# Patient Record
Sex: Female | Born: 1990 | Hispanic: No | Marital: Single | Smoking: Current every day smoker
Health system: Southern US, Community
[De-identification: ages and names within clinical notes are randomized; demographics above are authoritative.]

## PROBLEM LIST (undated history)

## (undated) DIAGNOSIS — F410 Panic disorder [episodic paroxysmal anxiety] without agoraphobia: Secondary | ICD-10-CM

## (undated) DIAGNOSIS — R011 Cardiac murmur, unspecified: Secondary | ICD-10-CM

## (undated) DIAGNOSIS — A749 Chlamydial infection, unspecified: Secondary | ICD-10-CM

## (undated) DIAGNOSIS — R87629 Unspecified abnormal cytological findings in specimens from vagina: Secondary | ICD-10-CM

## (undated) HISTORY — DX: Unspecified abnormal cytological findings in specimens from vagina: R87.629

## (undated) HISTORY — PX: WISDOM TOOTH EXTRACTION: SHX21

---

## 2010-08-04 NOTE — L&D Delivery Note (Signed)
Delivery Note At 3:11 AM a viable female was delivered via Vaginal, Spontaneous Delivery (Presentation: ; Occiput Anterior). Infant vigorous APGAR: 9, 9; weight 7 lb 4 oz (3289 g).   Placenta status: Intact, Spontaneous, Cord: 3 vessels. Placenta visible at introitus immediately after delivery of infant. Large amount of blood and clots accompanied placenta c/w possible abruption.  Cord pH: NA  Anesthesia: Epidural  Episiotomy: None Lacerations: None Suture Repair: NA Est. Blood Loss (mL): 500  Mom to postpartum.  Baby to nursery-stable. Placenta to pathology. Bottle feeding Nexplanon for contraception  Dorathy Kinsman 07/22/2011, 3:37 AM

## 2010-12-16 LAB — HIV ANTIBODY (ROUTINE TESTING W REFLEX): HIV: NONREACTIVE

## 2010-12-16 LAB — CBC
HCT: 34 % — AB (ref 36–46)
Hemoglobin: 11.9 g/dL — AB (ref 12.0–16.0)

## 2010-12-16 LAB — GC/CHLAMYDIA PROBE AMP, GENITAL
Chlamydia: POSITIVE
Gonorrhea: NEGATIVE

## 2010-12-16 LAB — RPR: RPR: NONREACTIVE

## 2011-07-09 LAB — STREP B DNA PROBE: GBS: NEGATIVE

## 2011-07-21 ENCOUNTER — Inpatient Hospital Stay (HOSPITAL_COMMUNITY): Payer: Medicaid Other | Admitting: Anesthesiology

## 2011-07-21 ENCOUNTER — Encounter (HOSPITAL_COMMUNITY): Payer: Self-pay

## 2011-07-21 ENCOUNTER — Encounter (HOSPITAL_COMMUNITY): Payer: Self-pay | Admitting: Anesthesiology

## 2011-07-21 ENCOUNTER — Inpatient Hospital Stay (HOSPITAL_COMMUNITY)
Admission: AD | Admit: 2011-07-21 | Discharge: 2011-07-23 | DRG: 775 | Disposition: A | Discharge status: Home / Self Care | Payer: Medicaid Other | Source: Ambulatory Visit | Attending: Obstetrics & Gynecology | Admitting: Obstetrics & Gynecology

## 2011-07-21 DIAGNOSIS — IMO0001 Reserved for inherently not codable concepts without codable children: Secondary | ICD-10-CM

## 2011-07-21 HISTORY — DX: Chlamydial infection, unspecified: A74.9

## 2011-07-21 HISTORY — DX: Panic disorder (episodic paroxysmal anxiety): F41.0

## 2011-07-21 HISTORY — DX: Cardiac murmur, unspecified: R01.1

## 2011-07-21 LAB — CBC
MCHC: 30 g/dL (ref 30.0–36.0)
MCV: 70.2 fL — ABNORMAL LOW (ref 78.0–100.0)
Platelets: 257 10*3/uL (ref 150–400)
RDW: 18.6 % — ABNORMAL HIGH (ref 11.5–15.5)
WBC: 14.8 10*3/uL — ABNORMAL HIGH (ref 4.0–10.5)

## 2011-07-21 MED ORDER — LACTATED RINGERS IV SOLN
INTRAVENOUS | Status: DC
  Administered 2011-07-21 – 2011-07-22 (×2): via INTRAUTERINE

## 2011-07-21 MED ORDER — OXYCODONE-ACETAMINOPHEN 5-325 MG PO TABS: 2.0000 | ORAL_TABLET | ORAL | Status: DC | PRN

## 2011-07-21 MED ORDER — EPHEDRINE 5 MG/ML INJ
10.0000 mg | INTRAVENOUS | Status: DC | PRN
  Filled 2011-07-21: qty 4

## 2011-07-21 MED ORDER — IBUPROFEN 600 MG PO TABS
600.0000 mg | ORAL_TABLET | Freq: Four times a day (QID) | ORAL | Status: DC | PRN
  Administered 2011-07-22: 600 mg via ORAL
  Filled 2011-07-21: qty 1

## 2011-07-21 MED ORDER — LIDOCAINE HCL (PF) 1 % IJ SOLN
30.0000 mL | INTRAMUSCULAR | Status: DC | PRN
Start: 2011-07-21 — End: 2011-07-22
  Filled 2011-07-21: qty 30

## 2011-07-21 MED ORDER — PHENYLEPHRINE 40 MCG/ML (10ML) SYRINGE FOR IV PUSH (FOR BLOOD PRESSURE SUPPORT): 80.0000 ug | PREFILLED_SYRINGE | INTRAVENOUS | Status: DC | PRN

## 2011-07-21 MED ORDER — EPHEDRINE 5 MG/ML INJ: 10.0000 mg | INTRAVENOUS | Status: DC | PRN

## 2011-07-21 MED ORDER — FENTANYL 2.5 MCG/ML BUPIVACAINE 1/10 % EPIDURAL INFUSION (WH - ANES)
INTRAMUSCULAR | Status: DC | PRN
  Administered 2011-07-21: 14 mL/h via EPIDURAL

## 2011-07-21 MED ORDER — PHENYLEPHRINE 40 MCG/ML (10ML) SYRINGE FOR IV PUSH (FOR BLOOD PRESSURE SUPPORT)
80.0000 ug | PREFILLED_SYRINGE | INTRAVENOUS | Status: DC | PRN
  Filled 2011-07-21: qty 5

## 2011-07-21 MED ORDER — SODIUM BICARBONATE 8.4 % IV SOLN
INTRAVENOUS | Status: DC | PRN
  Administered 2011-07-21: 4 mL via EPIDURAL

## 2011-07-21 MED ORDER — OXYTOCIN 20 UNITS IN LACTATED RINGERS INFUSION - SIMPLE
1.0000 m[IU]/min | INTRAVENOUS | Status: DC
  Administered 2011-07-21: 2 m[IU]/min via INTRAVENOUS

## 2011-07-21 MED ORDER — NALBUPHINE SYRINGE 5 MG/0.5 ML
10.0000 mg | INJECTION | INTRAMUSCULAR | Status: DC | PRN
  Administered 2011-07-21: 10 mg via INTRAVENOUS
  Filled 2011-07-21 (×2): qty 1

## 2011-07-21 MED ORDER — LACTATED RINGERS IV SOLN
500.0000 mL | INTRAVENOUS | Status: DC | PRN
  Administered 2011-07-22: 1000 mL via INTRAVENOUS

## 2011-07-21 MED ORDER — TERBUTALINE SULFATE 1 MG/ML IJ SOLN
0.2500 mg | Freq: Once | INTRAMUSCULAR | Status: AC | PRN
  Administered 2011-07-21: 0.25 mg via SUBCUTANEOUS
  Filled 2011-07-21: qty 1

## 2011-07-21 MED ORDER — NALBUPHINE SYRINGE 5 MG/0.5 ML
5.0000 mg | INJECTION | INTRAMUSCULAR | Status: DC | PRN
  Filled 2011-07-21: qty 0.5

## 2011-07-21 MED ORDER — OXYTOCIN 20 UNITS IN LACTATED RINGERS INFUSION - SIMPLE: 125.0000 mL/h | Freq: Once | INTRAVENOUS | Status: DC

## 2011-07-21 MED ORDER — LACTATED RINGERS IV SOLN: 500.0000 mL | Freq: Once | INTRAVENOUS | Status: DC

## 2011-07-21 MED ORDER — DIPHENHYDRAMINE HCL 50 MG/ML IJ SOLN: 12.5000 mg | INTRAMUSCULAR | Status: DC | PRN

## 2011-07-21 MED ORDER — ACETAMINOPHEN 325 MG PO TABS: 650.0000 mg | ORAL_TABLET | ORAL | Status: DC | PRN

## 2011-07-21 MED ORDER — OXYTOCIN BOLUS FROM INFUSION
500.0000 mL | Freq: Once | INTRAVENOUS | Status: AC
  Administered 2011-07-22: 500 mL via INTRAVENOUS
  Filled 2011-07-21: qty 1000
  Filled 2011-07-21: qty 500

## 2011-07-21 MED ORDER — ONDANSETRON HCL 4 MG/2ML IJ SOLN
4.0000 mg | Freq: Four times a day (QID) | INTRAMUSCULAR | Status: DC | PRN
  Administered 2011-07-21: 4 mg via INTRAVENOUS
  Filled 2011-07-21: qty 2

## 2011-07-21 MED ORDER — FENTANYL 2.5 MCG/ML BUPIVACAINE 1/10 % EPIDURAL INFUSION (WH - ANES)
14.0000 mL/h | INTRAMUSCULAR | Status: DC
  Administered 2011-07-21 – 2011-07-22 (×3): 14 mL/h via EPIDURAL
  Filled 2011-07-21 (×4): qty 60

## 2011-07-21 MED ORDER — LACTATED RINGERS IV SOLN
INTRAVENOUS | Status: DC
  Administered 2011-07-21 (×4): via INTRAVENOUS

## 2011-07-21 MED ORDER — FLEET ENEMA 7-19 GM/118ML RE ENEM: 1.0000 | ENEMA | RECTAL | Status: DC | PRN

## 2011-07-21 MED ORDER — CITRIC ACID-SODIUM CITRATE 334-500 MG/5ML PO SOLN
30.0000 mL | ORAL | Status: DC | PRN
  Filled 2011-07-21: qty 15

## 2011-07-21 NOTE — Progress Notes (Signed)
Dr. Maren Reamer on unit and will see patient shortly.

## 2011-07-21 NOTE — Progress Notes (Signed)
Patient ID: Julia Castro, female   DOB: 1990/08/23, 20 y.o.   MRN: [id number]  Patient is now requesting epidural for pain relief.  FHR 130 with average variability and accels, Category I UCs every 1.5-2 minutes now  Cx deferred  Will give epidural and recheck later.

## 2011-07-21 NOTE — Progress Notes (Signed)
Pt states lost mucus plug 2 days ago, notes bloody show this am, ctx's q46minutes apart, denies lof, +FM, unsure of cervical exam.

## 2011-07-21 NOTE — Anesthesia Procedure Notes (Signed)
Epidural Patient location during procedure: OB  Preanesthetic Checklist Completed: patient identified, site marked, surgical consent, pre-op evaluation, timeout performed, IV checked, risks and benefits discussed and monitors and equipment checked  Epidural Patient position: sitting Prep: site prepped and draped and DuraPrep Patient monitoring: continuous pulse ox and blood pressure Approach: midline Injection technique: LOR air  Needle:  Needle type: Tuohy  Needle gauge: 17 G Needle length: 9 cm Needle insertion depth: 6 cm Catheter type: closed end flexible Catheter size: 19 Gauge Catheter at skin depth: 13 cm Test dose: negative  Assessment Events: blood not aspirated, injection not painful, no injection resistance, negative IV test and no paresthesia  Additional Notes Dosing of Epidural:  1st dose, through needle ............................................Marland Kitchen epi 1:200K + Xylocaine 40 mg  2nd dose, through catheter, after waiting 3 minutes...Marland KitchenMarland Kitchenepi 1:200K + Xylocaine 40 mg  3rd dose, through catheter after waiting 3 minutes .............................Marcaine   4mg    ( mg Marcaine are expressed as equivilent  cc's medication removed from the 0.1%Bupiv / fentanyl syringe from L&D pump)  ( 2% Xylo charted as a single dose in Epic Meds for ease of charting; actual dosing was fractionated as above, for saftey's sake)  As each dose occurred, patient was free of IV sx; and patient exhibited no evidence of SA injection.  Patient is more comfortable after epidural dosed. Please see RN's note for documentation of vital signs,and FHR which are stable.

## 2011-07-21 NOTE — Progress Notes (Signed)
Dr. Debroah Loop on unit - informed him of sve, fetal well being and UC pattern. Attempted position changes and fetal response to those changes. Pitocin will remain off for now. No new orders received.

## 2011-07-21 NOTE — Progress Notes (Signed)
Julia Castro is a 20 y.o. G2P1001 at [redacted]w[redacted]d admitted for SOL.   Subjective: Pt is currently comfortable.  Rcvd nubain x1, ctx have slowed down recently.    Objective: BP 113/71  Pulse 96  Temp(Src) 98.4 F (36.9 C) (Oral)  Resp 18  Ht 5' 1.25" (1.556 m)  Wt 84.086 kg (185 lb 6 oz)  BMI 34.74 kg/m2      FHT:  FHR: 140 bpm, variability: moderate,  accelerations:  Present,  decelerations:  Absent UC:   irregular, every 6-10 minutes SVE:   Dilation: 5.5 Effacement (%): 80 Station: -2 Exam by:: [place] and M. Williams, cnm  Labs: Lab Results  Component Value Date   WBC 14.8* 07/21/2011   HGB 8.6* 07/21/2011   HCT 28.7* 07/21/2011   MCV 70.2* 07/21/2011   PLT 257 07/21/2011    Assessment / Plan: Spontaneous labor, ctx slowed AROM Continue expectant managment  Labor: Progressing normally Preeclampsia:  N/a  Fetal Wellbeing:  Category I Pain Control:  nubain I/D:  n/a Anticipated MOD:  NSVD  O'Grady, Aniesa Boback 07/21/2011, 1:45 PM

## 2011-07-21 NOTE — H&P (Signed)
Julia Castro is a 20 y.o. female presenting for Contractions.  Her pregnancy has been followed at [facility] and remarkable for Chlamydia in first part of pregnancy (treated).   Last Pregnancy was 2 years ago.  Plans bottlefeeding. Circ: this is a female  Maternal Medical History:  Reason for admission: Reason for admission: contractions.  Reason for Admission:   nauseaContractions: Onset was 3-5 hours ago.   Frequency: irregular.   Perceived severity is moderate.    Fetal activity: Perceived fetal activity is normal.   Last perceived fetal movement was within the past hour.    Prenatal complications: Bleeding.     OB History    Grav Para Term Preterm Abortions TAB SAB Ect Mult Living   2 1 1  0 0 0 0 0 0 1     Past Medical History  Diagnosis Date  . Panic attacks    Past Surgical History  Procedure Date  . Wisdom tooth extraction    Family History: family history is negative for Anesthesia problems. Social History:  reports that she has never smoked. She has never used smokeless tobacco. She reports that she does not drink alcohol or use illicit drugs.  Review of Systems  Constitutional: Negative for fever.  Gastrointestinal: Positive for abdominal pain. Negative for nausea.    Dilation: 3.5 Effacement (%): 70 Station: -3 Exam by:: Artelia Laroche CNM Blood pressure 139/59, pulse 96, temperature 98.5 F (36.9 C), temperature source Oral, resp. rate 18, height 5' 1.25" (1.556 m), weight 185 lb 6 oz (84.086 kg). Maternal Exam:  Uterine Assessment: Contraction strength is moderate.  Contraction frequency is irregular.   Abdomen: Fundal height is 39.   Estimated fetal weight is 8.   Fetal presentation: vertex  Introitus: Normal vulva. Vagina is positive for vaginal discharge.  Ferning test: not done.  Nitrazine test: not done. Amniotic fluid character: not assessed.  Pelvis: adequate for delivery.   Cervix: Cervix evaluated by digital exam.     Fetal  Exam Fetal Monitor Review: Mode: ultrasound.   Baseline rate: 135.  Variability: moderate (6-25 bpm).   Pattern: no decelerations and accelerations present.    Fetal State Assessment: Category I - tracings are normal.     Physical Exam  Constitutional: She is oriented to person, place, and time. She appears well-developed and well-nourished.  HENT:  Head: Normocephalic.  Cardiovascular: Normal rate.   Respiratory: Effort normal.  GI: Soft. She exhibits no distension. There is no tenderness. There is no rebound and no guarding.  Genitourinary: Uterus normal. Vaginal discharge found.  Musculoskeletal: Normal range of motion. She exhibits edema (Trace).  Neurological: She is alert and oriented to person, place, and time.  Skin: Skin is warm and dry.  Psychiatric: She has a normal mood and affect.    Prenatal labs: ABO, Rh:  O+ Antibody:  Neg Rubella:  Immune RPR:   NR HBsAg:   Neg HIV:   NR GBS:   Neg  Assessment/Plan: A:  IUP at [redacted]w[redacted]d      Early active labor.  Though contractions are somewhat irregular, her last cervical exam was closed and today is almost 4.  She seems somewhat uncomfortable with UCs and lives far away.       GBS Negative P:  Will admit for labor  [facility] 07/21/2011, 9:03 AM

## 2011-07-21 NOTE — Progress Notes (Signed)
Julia Castro is a 20 y.o. G2P1001 at 102w3d by ultrasound admitted for active labor  Subjective:   Objective: BP 111/54  Pulse 89  Temp(Src) 98.2 F (36.8 C) (Oral)  Resp 20  Ht 5' 1.25" (1.556 m)  Wt 84.086 kg (185 lb 6 oz)  BMI 34.74 kg/m2      FHT:  FHR: 120 bpm, variability: moderate,  accelerations:  Abscent,  decelerations:  Present At 1835, had variable decels which developed into prolonged decel/brady to 90s. UC:   regular, every 1-2  minutes SVE:   Dilation: 7 Effacement (%): 80 Station: -2 Exam by:: Wynelle Bourgeois, cnm  Labs: Lab Results  Component Value Date   WBC 14.8* 07/21/2011   HGB 8.6* 07/21/2011   HCT 28.7* 07/21/2011   MCV 70.2* 07/21/2011   PLT 257 07/21/2011    Assessment / Plan: Augmentation of labor, progressing well Cervix progressed to 7cm and head better applied Variable decels with one episode of bradycardia, probably related to frequent contractions.   Labor: slowly progressing due to prior asynclitism Preeclampsia:  n/a Fetal Wellbeing:  Category II Pain Control:  Epidural I/D:  n/a IUPC applied and Amnioinfusion begun.  ISE applied for easier assessment of FHR Pitocin turned off and Terbutaline given to give baby a rest Position changes and Trendelenberg employed with improvement in FHR seen  Anticipated MOD:  NSVD  [facility] 07/21/2011, 6:57 PM

## 2011-07-21 NOTE — Anesthesia Preprocedure Evaluation (Signed)
Anesthesia Evaluation  Patient identified by MRN, date of birth, ID band Patient awake    Reviewed: Allergy & Precautions, H&P , Patient's Chart, lab work & pertinent test results  Airway Mallampati: II TM Distance: >3 FB Neck ROM: full    Dental  (+) Teeth Intact   Pulmonary  clear to auscultation        Cardiovascular regular Normal    Neuro/Psych    GI/Hepatic   Endo/Other  Morbid obesity  Renal/GU      Musculoskeletal   Abdominal   Peds  Hematology   Anesthesia Other Findings       Reproductive/Obstetrics (+) Pregnancy                           Anesthesia Physical Anesthesia Plan  ASA: III  Anesthesia Plan: Epidural   Post-op Pain Management:    Induction:   Airway Management Planned:   Additional Equipment:   Intra-op Plan:   Post-operative Plan:   Informed Consent: I have reviewed the patients History and Physical, chart, labs and discussed the procedure including the risks, benefits and alternatives for the proposed anesthesia with the patient or authorized representative who has indicated his/her understanding and acceptance.   Dental Advisory Given  Plan Discussed with:   Anesthesia Plan Comments: (Labs checked- platelets confirmed with RN in room. Fetal heart tracing, per RN, reported to be stable enough for sitting procedure. Discussed epidural, and patient consents to the procedure:  included risk of possible headache,backache, failed block, allergic reaction, and nerve injury. This patient was asked if she had any questions or concerns before the procedure started. )        Anesthesia Quick Evaluation  

## 2011-07-22 ENCOUNTER — Other Ambulatory Visit: Payer: Self-pay | Admitting: Advanced Practice Midwife

## 2011-07-22 ENCOUNTER — Encounter (HOSPITAL_COMMUNITY): Payer: Self-pay | Admitting: *Deleted

## 2011-07-22 MED ORDER — BUPIVACAINE HCL (PF) 0.25 % IJ SOLN
INTRAMUSCULAR | Status: DC | PRN
  Administered 2011-07-22: 3 mL

## 2011-07-22 MED ORDER — WITCH HAZEL-GLYCERIN EX PADS
1.0000 "application " | MEDICATED_PAD | CUTANEOUS | Status: DC | PRN
  Administered 2011-07-22: 1 via TOPICAL

## 2011-07-22 MED ORDER — SENNOSIDES-DOCUSATE SODIUM 8.6-50 MG PO TABS
2.0000 | ORAL_TABLET | Freq: Every day | ORAL | Status: DC
  Administered 2011-07-22: 2 via ORAL

## 2011-07-22 MED ORDER — ONDANSETRON HCL 4 MG/2ML IJ SOLN: 4.0000 mg | INTRAMUSCULAR | Status: DC | PRN

## 2011-07-22 MED ORDER — FENTANYL CITRATE 0.05 MG/ML IJ SOLN: 200.0000 ug | Freq: Once | INTRAMUSCULAR | Status: DC

## 2011-07-22 MED ORDER — OXYCODONE-ACETAMINOPHEN 5-325 MG PO TABS
1.0000 | ORAL_TABLET | ORAL | Status: DC | PRN
  Administered 2011-07-22 (×2): 1 via ORAL
  Filled 2011-07-22 (×2): qty 1

## 2011-07-22 MED ORDER — DIBUCAINE 1 % RE OINT
1.0000 "application " | TOPICAL_OINTMENT | RECTAL | Status: DC | PRN
  Administered 2011-07-22: 1 via RECTAL
  Filled 2011-07-22: qty 28

## 2011-07-22 MED ORDER — DIPHENHYDRAMINE HCL 25 MG PO CAPS: 25.0000 mg | ORAL_CAPSULE | Freq: Four times a day (QID) | ORAL | Status: DC | PRN

## 2011-07-22 MED ORDER — LANOLIN HYDROUS EX OINT: TOPICAL_OINTMENT | CUTANEOUS | Status: DC | PRN

## 2011-07-22 MED ORDER — TETANUS-DIPHTH-ACELL PERTUSSIS 5-2.5-18.5 LF-MCG/0.5 IM SUSP
0.5000 mL | Freq: Once | INTRAMUSCULAR | Status: AC
  Administered 2011-07-23: 0.5 mL via INTRAMUSCULAR
  Filled 2011-07-22: qty 0.5

## 2011-07-22 MED ORDER — SIMETHICONE 80 MG PO CHEW: 80.0000 mg | CHEWABLE_TABLET | ORAL | Status: DC | PRN

## 2011-07-22 MED ORDER — FENTANYL CITRATE 0.05 MG/ML IJ SOLN
INTRAMUSCULAR | Status: DC | PRN
  Administered 2011-07-22: 100 ug via INTRAVENOUS

## 2011-07-22 MED ORDER — ZOLPIDEM TARTRATE 5 MG PO TABS: 5.0000 mg | ORAL_TABLET | Freq: Every evening | ORAL | Status: DC | PRN

## 2011-07-22 MED ORDER — BENZOCAINE-MENTHOL 20-0.5 % EX AERO: 1.0000 "application " | INHALATION_SPRAY | CUTANEOUS | Status: DC | PRN

## 2011-07-22 MED ORDER — PRENATAL PLUS 27-1 MG PO TABS
1.0000 | ORAL_TABLET | Freq: Every day | ORAL | Status: DC
  Administered 2011-07-22 – 2011-07-23 (×2): 1 via ORAL
  Filled 2011-07-22 (×2): qty 1

## 2011-07-22 MED ORDER — FENTANYL CITRATE 0.05 MG/ML IJ SOLN: 100.0000 ug | Freq: Once | INTRAMUSCULAR | Status: DC

## 2011-07-22 MED ORDER — ONDANSETRON HCL 4 MG PO TABS: 4.0000 mg | ORAL_TABLET | ORAL | Status: DC | PRN

## 2011-07-22 MED ORDER — IBUPROFEN 600 MG PO TABS
600.0000 mg | ORAL_TABLET | Freq: Four times a day (QID) | ORAL | Status: DC
Start: 2011-07-22 — End: 2011-07-23
  Administered 2011-07-22 – 2011-07-23 (×5): 600 mg via ORAL
  Filled 2011-07-22 (×5): qty 1

## 2011-07-22 MED ORDER — FENTANYL CITRATE 0.05 MG/ML IJ SOLN
INTRAMUSCULAR | Status: AC
  Filled 2011-07-22: qty 2

## 2011-07-22 NOTE — Anesthesia Postprocedure Evaluation (Signed)
  Anesthesia Post Note  Patient: Julia Castro  Procedure(s) Performed: * No procedures listed *  Anesthesia type: Epidural  Patient location: Mother/Baby  Post pain: Pain level controlled  Post assessment: Post-op Vital signs reviewed  Last Vitals:  Filed Vitals:   07/22/11 0640  BP: 139/76  Pulse: 125  Temp: 37.1 C  Resp: 20    Post vital signs: Reviewed  Level of consciousness:alert  Complications: No apparent anesthesia complications

## 2011-07-22 NOTE — Progress Notes (Signed)
UR chart review completed.  

## 2011-07-22 NOTE — Progress Notes (Signed)
Domenica L Glendenning is a 20 y.o. G2P1001 at [redacted]w[redacted]d.   Subjective: Back pain w/ last UC.  Objective: BP 130/67  Pulse 88  Temp(Src) 99.2 F (37.3 C) (Axillary)  Resp 16  Ht 5' 1.25" (1.556 m)  Wt 84.086 kg (185 lb 6 oz)  BMI 34.74 kg/m2  SpO2 100%   Total I/O In: -  Out: 800 [Urine:800]  FHT:  FHR: 140 bpm, variability: moderate,  accelerations:  Present,  decelerations:  Present repetitive mild, occassional moderate variables. Prolonged decel x 5 minutes down to 80's after position change. Resolved w/ position change back to left tilt. UC:   regular, every 2-4 minutes. MVU's 150-160 SVE:   Dilation: 8 Effacement (%): 90 Station: 0 Exam by:: Rhyder Bratz, CNM   Labs:  Assessment / Plan: Protracted active phase  Labor: No progress since last exam, but inadequate contractions, unable to restart pitocin due to decels. Preeclampsia:  NA Fetal Wellbeing:  Category II, overall reassuring Pain Control:  Epidural I/D:  n/a Anticipated MOD:  Uncertain. Discussed possible need for C/S if decels worsen or if labor does not progress w/ UC's tolerable to baby. Dr. Debroah Loop notified of decels. Agrees w/ POC.  Luverna Degenhart 07/22/2011, 12:14 AM

## 2011-07-22 NOTE — Addendum Note (Signed)
Addendum  created 07/22/11 0757 by Cephus Shelling   Modules edited:Charges VN, Notes Section

## 2011-07-22 NOTE — Anesthesia Postprocedure Evaluation (Signed)
Anesthesia Post Note  Patient: Julia Castro  Procedure(s) Performed: * No procedures listed *  Anesthesia type: Epidural  Patient location: Mother/Baby  Post pain: Pain level controlled  Post assessment: Post-op Vital signs reviewed  Last Vitals:  Filed Vitals:   07/22/11 0346  BP: 100/57  Pulse: 113  Temp:   Resp:     Post vital signs: Reviewed  Level of consciousness: awake  Complications: No apparent anesthesia complications

## 2011-07-22 NOTE — Progress Notes (Signed)
Julia Castro is a 20 y.o. G2P1001 at [redacted]w[redacted]d   Subjective: Comfortable w/ epidural  Objective: BP 112/66  Pulse 83  Temp(Src) 99.2 F (37.3 C) (Axillary)  Resp 16  Ht 5' 1.25" (1.556 m)  Wt 84.086 kg (185 lb 6 oz)  BMI 34.74 kg/m2  SpO2 100%   Total I/O In: -  Out: 800 [Urine:800]  FHT:  FHR: 140-150 bpm, variability: moderate,  accelerations:  Present,  decelerations:  Present Variables UC:   regular, every 2-4 minutes, MVU's150-160 SVE:   Dilation: 8 Effacement (%): 90 Station: 0 Exam by::RN  Labs:  Assessment / Plan: Spontaneous labor, progressing normally  Labor: Progressing normally Preeclampsia:  NA Fetal Wellbeing:  Category II Pain Control:  Epidural I/D:  n/a Anticipated MOD:  NSVD  Julia Castro 07/22/2011, 12:52 AM

## 2011-07-22 NOTE — Progress Notes (Signed)
Julia Castro is a 20 y.o. G2P1001 at [redacted]w[redacted]d. CNM called to BS due to panic attack. Pt calm when CNM arrived in room. States she was panicking because of contraction pain, general discomfort and concern about decelerations.   Subjective: Pain in right side of back w/ UC's.   Objective: BP 130/67  Pulse 88  Temp(Src) 99.2 F (37.3 C) (Axillary)  Resp 16  Ht 5' 1.25" (1.556 m)  Wt 84.086 kg (185 lb 6 oz)  BMI 34.74 kg/m2  SpO2 100%   Total I/O In: -  Out: 800 [Urine:800]  FHT:  FHR: 155 bpm, variability: moderate,  accelerations:  Abscent,  decelerations:  Present Variables significantly improved w/ position changes, D/C pitocin. Category II now. UC:   regular, every 2-4 minutes. SVE:   Dilation: 7 Effacement (%): 90 Station: -1 Exam by:: C. Blackstock, RN  Labs:  Assessment / Plan: Spontaneous labor, progressing normally, variables improving w/ interventions.  Labor: Progressing normally Preeclampsia:  NA Fetal Wellbeing:  Category II Pain Control:  Epidural. Will try PCA. I/D:  n/a Anticipated MOD:  NSVD  Brinton Brandel 07/21/2011, 20:38 AM

## 2011-07-22 NOTE — Addendum Note (Signed)
Addendum  created 07/22/11 0756 by Cephus Shelling   Modules edited:Notes Section

## 2011-07-22 NOTE — Addendum Note (Signed)
Addendum  created 07/22/11 0757 by Cephus Shelling   Modules edited:Charges VN

## 2011-07-22 NOTE — Progress Notes (Signed)

## 2011-07-23 LAB — CBC
Hemoglobin: 6.4 g/dL — CL (ref 12.0–15.0)
MCHC: 29.9 g/dL — ABNORMAL LOW (ref 30.0–36.0)
Platelets: 233 10*3/uL (ref 150–400)

## 2011-07-23 MED ORDER — SENNOSIDES-DOCUSATE SODIUM 8.6-50 MG PO TABS: 2.0000 | ORAL_TABLET | Freq: Every day | ORAL | Status: DC

## 2011-07-23 MED ORDER — SENNOSIDES-DOCUSATE SODIUM 8.6-50 MG PO TABS: 2.0000 | ORAL_TABLET | Freq: Every day | ORAL | Status: AC | PRN

## 2011-07-23 NOTE — Progress Notes (Signed)
Post Partum Day 1 Subjective: no complaints, up ad lib, voiding, tolerating PO and denies any lightheadedness, ShOB, dizziness, weakness.   Objective: Blood pressure 94/57, pulse 77, temperature 98.2 F (36.8 C), temperature source Oral, resp. rate 18, height 5' 1.25" (1.556 m), weight 84.086 kg (185 lb 6 oz), SpO2 99.00%, unknown if currently breastfeeding.  Physical Exam:  General: alert, cooperative and no distress Resp: CTAB Card: S1, S2; systolic murmur (3/6) Lochia: appropriate Uterine Fundus: firm Incision: n/a  DVT Evaluation: No cords or calf tenderness. No significant calf/ankle edema.   Basename 07/23/11 0525 07/21/11 0955  HGB 6.4* 8.6*  HCT 21.4* 28.7*    Assessment/Plan: Pt would like to go home today.  Asx anemia (Hgb 6.4), though tachycardic all day yesterday, will discuss with team.  Contraception: implanon. Bottle feeding.     LOS: 2 days   Levada Schilling 07/23/2011, 7:31 AM

## 2011-07-23 NOTE — Progress Notes (Signed)
CRITICAL VALUE ALERT  Critical value received: Hgb 6.4  Date of notification: 07/23/11  Time of notification:  0640     Critical value read back :yes  Nurse who received alert:  Marylene Land Woodard Perrell RN   MD notified (1st page):  With morning rounds  Time of first page: 0700  MD notified (2nd page):on rounds  Time of second page:asdf   Responding MD:  On  Time MD responded:  Am

## 2011-07-23 NOTE — Discharge Summary (Signed)
Obstetric Discharge Summary Julia Castro now P2002 who delivered at [redacted]w[redacted]d by NSVD, female infant.  Reason for Admission: onset of labor Prenatal Procedures: none Intrapartum Procedures: spontaneous vaginal delivery Postpartum Procedures: none Complications-Operative and Postpartum: none Hemoglobin  Date Value Range Status  07/23/2011 6.4* 12.0-15.0 (g/dL) Final     DELTA CHECK NOTED     REPEATED TO VERIFY     CRITICAL RESULT CALLED TO, READ BACK BY AND VERIFIED WITH:     BERRIER, A. @ 0636 ON 07/23/11 BY BOVELL. A     HCT  Date Value Range Status  07/23/2011 21.4* 36.0-46.0 (%) Final    Discharge Diagnoses: Term Pregnancy-delivered  Discharge Information: Date: 07/23/2011 Activity: pelvic rest Diet: routine Medications: Iron and senna Condition: stable Instructions: refer to practice specific booklet, pt should call or return to care with any questions or concerns.  Discharge to: home Follow-up Information    Follow up with [facility] in 1 week. (PLease call to make an appointment to check blood levels.  Post partum visit in 6 weeks. )    Contact information:   [address] [place] [address]          Newborn Data: Live born female  Birth Weight: 7 lb 4 oz (3289 g) APGAR: 9, 9  Home with mother.  Maren Reamer, Darene Nappi 07/23/2011, 10:33 AM

## 2011-07-30 NOTE — Discharge Summary (Signed)
Attestation of Attending Supervision of Resident: Evaluation and management procedures were performed by the Family Medicine Resident under my supervision.  I have reviewed the resident's note, chart reviewed and agree with management and plan.  Yeraldi Fidler, M.D. 07/30/2011 8:32 AM    

## 2012-12-07 ENCOUNTER — Encounter: Payer: Self-pay | Admitting: Obstetrics & Gynecology

## 2012-12-07 ENCOUNTER — Ambulatory Visit (INDEPENDENT_AMBULATORY_CARE_PROVIDER_SITE_OTHER): Payer: Medicaid Other | Admitting: Obstetrics & Gynecology

## 2012-12-07 VITALS — BP 110/60 | Ht 61.0 in | Wt 168.0 lb

## 2012-12-07 DIAGNOSIS — Z3201 Encounter for pregnancy test, result positive: Secondary | ICD-10-CM

## 2012-12-07 DIAGNOSIS — Z3200 Encounter for pregnancy test, result unknown: Secondary | ICD-10-CM

## 2012-12-08 ENCOUNTER — Other Ambulatory Visit: Payer: Self-pay | Admitting: Obstetrics & Gynecology

## 2012-12-08 DIAGNOSIS — O3680X0 Pregnancy with inconclusive fetal viability, not applicable or unspecified: Secondary | ICD-10-CM

## 2012-12-15 ENCOUNTER — Other Ambulatory Visit: Payer: Medicaid Other

## 2012-12-23 ENCOUNTER — Encounter: Payer: Medicaid Other | Admitting: Advanced Practice Midwife

## 2013-08-04 ENCOUNTER — Encounter (HOSPITAL_COMMUNITY): Payer: Self-pay | Admitting: Emergency Medicine

## 2013-08-04 ENCOUNTER — Emergency Department (HOSPITAL_COMMUNITY)
Admission: EM | Admit: 2013-08-04 | Discharge: 2013-08-04 | Disposition: A | Discharge status: Home / Self Care | Payer: Medicaid Other
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
## 2013-09-04 HISTORY — PX: INDUCED ABORTION: SHX677

## 2014-06-05 ENCOUNTER — Encounter (HOSPITAL_COMMUNITY): Payer: Self-pay | Admitting: Emergency Medicine

## 2015-04-10 ENCOUNTER — Other Ambulatory Visit (HOSPITAL_COMMUNITY)
Admission: RE | Admit: 2015-04-10 | Discharge: 2015-04-10 | Disposition: A | Discharge status: Home / Self Care | Payer: Medicaid Other | Source: Ambulatory Visit | Attending: Obstetrics & Gynecology | Admitting: Obstetrics & Gynecology

## 2015-04-10 ENCOUNTER — Encounter: Payer: Self-pay | Admitting: Obstetrics & Gynecology

## 2015-04-10 ENCOUNTER — Ambulatory Visit (INDEPENDENT_AMBULATORY_CARE_PROVIDER_SITE_OTHER): Payer: Medicaid Other | Admitting: Obstetrics & Gynecology

## 2015-04-10 VITALS — BP 120/80 | HR 72 | Ht 61.0 in | Wt 174.0 lb

## 2015-04-10 DIAGNOSIS — Z01419 Encounter for gynecological examination (general) (routine) without abnormal findings: Secondary | ICD-10-CM

## 2015-04-10 DIAGNOSIS — Z0000 Encounter for general adult medical examination without abnormal findings: Secondary | ICD-10-CM

## 2015-04-10 MED ORDER — DESOGESTREL-ETHINYL ESTRADIOL 0.15-30 MG-MCG PO TABS: 1.0000 | ORAL_TABLET | Freq: Every day | ORAL | Status: DC

## 2015-04-10 NOTE — Progress Notes (Signed)
Patient ID: Julia Castro, female   DOB: 11-Mar-1991, 24 y.o.   MRN: [id number] Subjective:     Julia Castro is a 24 y.o. female here for a routine exam.  Patient's last menstrual period was 04/05/2015. [id number] Birth Control Method:  none Menstrual Calendar(currently): regular  Current complaints: volume of discharge, multiple evals are negative.   Current acute medical issues:  none   Recent Gynecologic History Patient's last menstrual period was 04/05/2015. Last Pap: 11/2013 ,  normal Last mammogram: ,  Not applicable: age  Past Medical History  Diagnosis Date  . Panic attacks   . Heart murmur     Benign  . Chlamydia     Past Surgical History  Procedure Laterality Date  . Wisdom tooth extraction      OB History    Gravida Para Term Preterm AB TAB SAB Ectopic Multiple Living   0 0 0 0 0 0 2      Social History   Social History  . Marital Status: Single    Spouse Name: N/A  . Number of Children: N/A  . Years of Education: N/A   Social History Main Topics  . Smoking status: Never Smoker   . Smokeless tobacco: Never Used  . Alcohol Use: No  . Drug Use: No  . Sexual Activity: Yes    Birth Control/ Protection: None   Other Topics Concern  . None   Social History Narrative    Family History  Problem Relation Age of Onset  . Anesthesia problems Neg Hx      Current outpatient prescriptions:  .  flintstones complete (FLINTSTONES) 60 MG chewable tablet, Chew 1 tablet by mouth daily.  , Disp: , Rfl:  .  IRON PO, Take 1 tablet by mouth daily.  , Disp: , Rfl:  .  omeprazole (PRILOSEC) 20 MG capsule, Take 20 mg by mouth daily.  , Disp: , Rfl:   Review of Systems  Review of Systems  Constitutional: Negative for fever, chills, weight loss, malaise/fatigue and diaphoresis.  HENT: Negative for hearing loss, ear pain, nosebleeds, congestion, sore throat, neck pain, tinnitus and ear discharge.   Eyes: Negative for blurred vision, double vision, photophobia,  pain, discharge and redness.  Respiratory: Negative for cough, hemoptysis, sputum production, shortness of breath, wheezing and stridor.   Cardiovascular: Negative for chest pain, palpitations, orthopnea, claudication, leg swelling and PND.  Gastrointestinal: negative for abdominal pain. Negative for heartburn, nausea, vomiting, diarrhea, constipation, blood in stool and melena.  Genitourinary: Negative for dysuria, urgency, frequency, hematuria and flank pain.  Musculoskeletal: Negative for myalgias, back pain, joint pain and falls.  Skin: Negative for itching and rash.  Neurological: Negative for dizziness, tingling, tremors, sensory change, speech change, focal weakness, seizures, loss of consciousness, weakness and headaches.  Endo/Heme/Allergies: Negative for environmental allergies and polydipsia. Does not bruise/bleed easily.  Psychiatric/Behavioral: Negative for depression, suicidal ideas, hallucinations, memory loss and substance abuse. The patient is not nervous/anxious and does not have insomnia.        Objective:  Blood pressure 120/80, pulse 72, height  (1.549 m), weight 174 lb (78.926 kg), last menstrual period 04/05/2015.   Physical Exam  Vitals reviewed. Constitutional: She is oriented to person, place, and time. She appears well-developed and well-nourished.  HENT:  Head: Normocephalic and atraumatic.        Right Ear: External ear normal.  Left Ear: External ear normal.  Nose: Nose normal.  Mouth/Throat: Oropharynx is clear and  moist.  Eyes: Conjunctivae and EOM are normal. Pupils are equal, round, and reactive to light. Right eye exhibits no discharge. Left eye exhibits no discharge. No scleral icterus.  Neck: Normal range of motion. Neck supple. No tracheal deviation present. No thyromegaly present.  Cardiovascular: Normal rate, regular rhythm, normal heart sounds and intact distal pulses.  Exam reveals no gallop and no friction rub.   No murmur  heard. Respiratory: Effort normal and breath sounds normal. No respiratory distress. She has no wheezes. She has no rales. She exhibits no tenderness.  GI: Soft. Bowel sounds are normal. She exhibits no distension and no mass. There is no tenderness. There is no rebound and no guarding.  Genitourinary:  Breasts no masses skin changes or nipple changes bilaterally      Vulva is normal without lesions Vagina is pink moist without discharge Cervix normal in appearance and pap is done Uterus is normal size shape and contour Adnexa is negative with normal sized ovaries   Musculoskeletal: Normal range of motion. She exhibits no edema and no tenderness.  Neurological: She is alert and oriented to person, place, and time. She has normal reflexes. She displays normal reflexes. No cranial nerve deficit. She exhibits normal muscle tone. Coordination normal.  Skin: Skin is warm and dry. No rash noted. No erythema. No pallor.  Psychiatric: She has a normal mood and affect. Her behavior is normal. Judgment and thought content normal.       Assessment:    Healthy female exam.    Plan:    Contraception: OCP (estrogen/progesterone).    Desogestrel/EE 30 mics/monophasic Discussed strategies for remembering OCP, D/C nexplanon after 2 months due to bleeding

## 2015-04-11 LAB — CYTOLOGY - PAP

## 2015-05-08 ENCOUNTER — Telehealth: Payer: Self-pay | Admitting: Obstetrics & Gynecology

## 2015-05-08 NOTE — Telephone Encounter (Signed)
Pt states been taking birth control pills since her appt with Dr. Despina Hidden on 04/10/2015 started the "blue pill" last Tuesday now having a period, heavy bleeding and clots over the weekend but now vaginal bleeding has improved and no clots. Pt states she did miss one of her pills. Pt informed per Joellyn Haff, CNM to take a pregnancy test due to missing a pill and continue to monitor the vaginal bleeding. If pt has to change her period q hour and frequent clots call our office back. Pt verbalized understanding.

## 2015-06-11 ENCOUNTER — Telehealth: Payer: Self-pay | Admitting: Obstetrics & Gynecology

## 2015-06-11 MED ORDER — NORGESTIMATE-ETH ESTRADIOL 0.25-35 MG-MCG PO TABS: 1.0000 | ORAL_TABLET | Freq: Every day | ORAL | Status: DC

## 2015-08-05 NOTE — L&D Delivery Note (Signed)
Delivery Note  After a 5 minute 2nd stage, at 0315  a viable female was delivered via  (Presentation: ).  APGAR:  ; weight pending.  40 units of pitocin diluted in 1000cc LR was infused rapidly IV.  After 2 minutes, the cord was clamped and cut. 40 units of pitocin diluted in 1000cc LR was infused rapidly IV.  The placenta separated spontaneously and delivered via CCT and maternal pushing effort.  It was inspected and appears to be intact with a 3 VC.  There were the following complications:   Anesthesia: Epidural  Episiotomy: none Lacerations: none Suture Repair: n/a Est. Blood Loss (mL):   Mom to postpartum.  Baby to Couplet care / Skin to Skin   Delivery by Link SnufferEddie, Med student, under my direct supervision. .       After 2 minutes, the cord was clamped and cut. 40 units of pitocin diluted in 1000cc LR was infused rapidly IV.  The placenta separated spontaneously and delivered via CCT and maternal pushing effort.  It was inspected and appears to be intact with a 3 VC.

## 2015-08-09 ENCOUNTER — Encounter: Payer: Self-pay | Admitting: Adult Health

## 2015-08-09 ENCOUNTER — Ambulatory Visit (INDEPENDENT_AMBULATORY_CARE_PROVIDER_SITE_OTHER): Payer: Medicaid Other | Admitting: Adult Health

## 2015-08-09 VITALS — BP 140/82 | HR 82 | Ht 61.0 in | Wt 180.0 lb

## 2015-08-09 DIAGNOSIS — R1084 Generalized abdominal pain: Secondary | ICD-10-CM | POA: Diagnosis not present

## 2015-08-09 DIAGNOSIS — N926 Irregular menstruation, unspecified: Secondary | ICD-10-CM | POA: Diagnosis not present

## 2015-08-09 DIAGNOSIS — Z3201 Encounter for pregnancy test, result positive: Secondary | ICD-10-CM

## 2015-08-09 DIAGNOSIS — O3680X0 Pregnancy with inconclusive fetal viability, not applicable or unspecified: Secondary | ICD-10-CM

## 2015-08-09 DIAGNOSIS — Z3490 Encounter for supervision of normal pregnancy, unspecified, unspecified trimester: Secondary | ICD-10-CM

## 2015-08-09 LAB — POCT URINE PREGNANCY: Preg Test, Ur: POSITIVE — AB

## 2015-08-09 NOTE — Patient Instructions (Signed)
Return in 1 week for US  First Trimester of Pregnancy The first trimester of pregnancy is from week 1 until the end of week 12 (months 1 through 3). A week after a sperm fertilizes an egg, the egg will implant on the wall of the uterus. This embryo will begin to develop into a baby. Genes from you and your partner are forming the baby. The female genes determine whether the baby is a boy or a girl. At 6-8 weeks, the eyes and face are formed, and the heartbeat can be seen on ultrasound. At the end of 12 weeks, all the baby's organs are formed.  Now that you are pregnant, you will want to do everything you can to have a healthy baby. Two of the most important things are to get good prenatal care and to follow your health care provider's instructions. Prenatal care is all the medical care you receive before the baby's birth. This care will help prevent, find, and treat any problems during the pregnancy and childbirth. BODY CHANGES Your body goes through many changes during pregnancy. The changes vary from woman to woman.   You may gain or lose a couple of pounds at first.  You may feel sick to your stomach (nauseous) and throw up (vomit). If the vomiting is uncontrollable, call your health care provider.  You may tire easily.  You may develop headaches that can be relieved by medicines approved by your health care provider.  You may urinate more often. Painful urination may mean you have a bladder infection.  You may develop heartburn as a result of your pregnancy.  You may develop constipation because certain hormones are causing the muscles that push waste through your intestines to slow down.  You may develop hemorrhoids or swollen, bulging veins (varicose veins).  Your breasts may begin to grow larger and become tender. Your nipples may stick out more, and the tissue that surrounds them (areola) may become darker.  Your gums may bleed and may be sensitive to brushing and flossing.  Dark  spots or blotches (chloasma, mask of pregnancy) may develop on your face. This will likely fade after the baby is born.  Your menstrual periods will stop.  You may have a loss of appetite.  You may develop cravings for certain kinds of food.  You may have changes in your emotions from day to day, such as being excited to be pregnant or being concerned that something may go wrong with the pregnancy and baby.  You may have more vivid and strange dreams.  You may have changes in your hair. These can include thickening of your hair, rapid growth, and changes in texture. Some women also have hair loss during or after pregnancy, or hair that feels dry or thin. Your hair will most likely return to normal after your baby is born. WHAT TO EXPECT AT YOUR PRENATAL VISITS During a routine prenatal visit:  You will be weighed to make sure you and the baby are growing normally.  Your blood pressure will be taken.  Your abdomen will be measured to track your baby's growth.  The fetal heartbeat will be listened to starting around week 10 or 12 of your pregnancy.  Test results from any previous visits will be discussed. Your health care provider may ask you:  How you are feeling.  If you are feeling the baby move.  If you have had any abnormal symptoms, such as leaking fluid, bleeding, severe headaches, or abdominal cramping.    If you are using any tobacco products, including cigarettes, chewing tobacco, and electronic cigarettes.  If you have any questions. Other tests that may be performed during your first trimester include:  Blood tests to find your blood type and to check for the presence of any previous infections. They will also be used to check for low iron levels (anemia) and Rh antibodies. Later in the pregnancy, blood tests for diabetes will be done along with other tests if problems develop.  Urine tests to check for infections, diabetes, or protein in the urine.  An ultrasound to  confirm the proper growth and development of the baby.  An amniocentesis to check for possible genetic problems.  Fetal screens for spina bifida and Down syndrome.  You may need other tests to make sure you and the baby are doing well.  HIV (human immunodeficiency virus) testing. Routine prenatal testing includes screening for HIV, unless you choose not to have this test. HOME CARE INSTRUCTIONS  Medicines  Follow your health care provider's instructions regarding medicine use. Specific medicines may be either safe or unsafe to take during pregnancy.  Take your prenatal vitamins as directed.  If you develop constipation, try taking a stool softener if your health care provider approves. Diet  Eat regular, well-balanced meals. Choose a variety of foods, such as meat or vegetable-based protein, fish, milk and low-fat dairy products, vegetables, fruits, and whole grain breads and cereals. Your health care provider will help you determine the amount of weight gain that is right for you.  Avoid raw meat and uncooked cheese. These carry germs that can cause birth defects in the baby.  Eating four or five small meals rather than three large meals a day may help relieve nausea and vomiting. If you start to feel nauseous, eating a few soda crackers can be helpful. Drinking liquids between meals instead of during meals also seems to help nausea and vomiting.  If you develop constipation, eat more high-fiber foods, such as fresh vegetables or fruit and whole grains. Drink enough fluids to keep your urine clear or pale yellow. Activity and Exercise  Exercise only as directed by your health care provider. Exercising will help you:  Control your weight.  Stay in shape.  Be prepared for labor and delivery.  Experiencing pain or cramping in the lower abdomen or low back is a good sign that you should stop exercising. Check with your health care provider before continuing normal exercises.  Try  to avoid standing for long periods of time. Move your legs often if you must stand in one place for a long time.  Avoid heavy lifting.  Wear low-heeled shoes, and practice good posture.  You may continue to have sex unless your health care provider directs you otherwise. Relief of Pain or Discomfort  Wear a good support bra for breast tenderness.   Take warm sitz baths to soothe any pain or discomfort caused by hemorrhoids. Use hemorrhoid cream if your health care provider approves.   Rest with your legs elevated if you have leg cramps or low back pain.  If you develop varicose veins in your legs, wear support hose. Elevate your feet for 15 minutes, 3-4 times a day. Limit salt in your diet. Prenatal Care  Schedule your prenatal visits by the twelfth week of pregnancy. They are usually scheduled monthly at first, then more often in the last 2 months before delivery.  Write down your questions. Take them to your prenatal visits.  Keep all   your prenatal visits as directed by your health care provider. Safety  Wear your seat belt at all times when driving.  Make a list of emergency phone numbers, including numbers for family, friends, the hospital, and police and fire departments. General Tips  Ask your health care provider for a referral to a local prenatal education class. Begin classes no later than at the beginning of month 6 of your pregnancy.  Ask for help if you have counseling or nutritional needs during pregnancy. Your health care provider can offer advice or refer you to specialists for help with various needs.  Do not use hot tubs, steam rooms, or saunas.  Do not douche or use tampons or scented sanitary pads.  Do not cross your legs for long periods of time.  Avoid cat litter boxes and soil used by cats. These carry germs that can cause birth defects in the baby and possibly loss of the fetus by miscarriage or stillbirth.  Avoid all smoking, herbs, alcohol, and  medicines not prescribed by your health care provider. Chemicals in these affect the formation and growth of the baby.  Do not use any tobacco products, including cigarettes, chewing tobacco, and electronic cigarettes. If you need help quitting, ask your health care provider. You may receive counseling support and other resources to help you quit.  Schedule a dentist appointment. At home, brush your teeth with a soft toothbrush and be gentle when you floss. SEEK MEDICAL CARE IF:   You have dizziness.  You have mild pelvic cramps, pelvic pressure, or nagging pain in the abdominal area.  You have persistent nausea, vomiting, or diarrhea.  You have a bad smelling vaginal discharge.  You have pain with urination.  You notice increased swelling in your face, hands, legs, or ankles. SEEK IMMEDIATE MEDICAL CARE IF:   You have a fever.  You are leaking fluid from your vagina.  You have spotting or bleeding from your vagina.  You have severe abdominal cramping or pain.  You have rapid weight gain or loss.  You vomit blood or material that looks like coffee grounds.  You are exposed to German measles and have never had them.  You are exposed to fifth disease or chickenpox.  You develop a severe headache.  You have shortness of breath.  You have any kind of trauma, such as from a fall or a car accident.   This information is not intended to replace advice given to you by your health care provider. Make sure you discuss any questions you have with your health care provider.   Document Released: 07/15/2001 Document Revised: 08/11/2014 Document Reviewed: 05/31/2013 Elsevier Interactive Patient Education 2016 [employer]  

## 2015-08-09 NOTE — Progress Notes (Signed)
Subjective:     Patient ID: Francee GentileJazmin L Razzano, female   DOB: 03/09/1991, 25 y.o.   MRN: [id number]  HPI Nolon StallsJazmin is a 25 year old black female in for UPT, has missed a period and has some abdominal pain, across stomach, no bleeding, has dry scaly nipples.   Review of Systems Patient denies any headaches, hearing loss, fatigue, blurred vision, shortness of breath, chest pain, abdominal pain, problems with bowel movements, urination, or intercourse. No joint pain or mood swings.See HPI for positives.  Reviewed past medical,surgical, social and family history. Reviewed medications and allergies.     Objective:   Physical Exam BP 140/82 mmHg  Pulse 82  Ht 5\' 1"  (1.549 m)  Wt 180 lb (81.647 kg)  BMI 34.03 kg/m2  LMP 06/29/2015 +UPT, about 5+6 weeks by MP, with EDD 04/04/16. Skin warm and dry. Neck: mid line trachea, normal thyroid, good ROM, no lymphadenopathy noted. Lungs: clear to ausculation bilaterally. Cardiovascular: regular rate and rhythm.abdomen soft non tender, nipples look ok today has lotion on, no discharge    Assessment:     Pregnant     Plan:     Return in 1 week for dating US Review handout on first trimester Continue flinstones

## 2015-08-16 ENCOUNTER — Ambulatory Visit (INDEPENDENT_AMBULATORY_CARE_PROVIDER_SITE_OTHER): Payer: Medicaid Other

## 2015-08-16 DIAGNOSIS — O3680X0 Pregnancy with inconclusive fetal viability, not applicable or unspecified: Secondary | ICD-10-CM | POA: Diagnosis not present

## 2015-08-16 NOTE — Progress Notes (Signed)
US 6+6wks,single IUP pos fht 126 bpm,normal ov's bilat,crl 8.677mm,subchorionic hemorrhage 4.9 x 3.9 x 1.1 cm,pt is NOT having any pain or bleeding.

## 2015-08-29 ENCOUNTER — Ambulatory Visit (INDEPENDENT_AMBULATORY_CARE_PROVIDER_SITE_OTHER): Payer: Medicaid Other | Admitting: Women's Health

## 2015-08-29 ENCOUNTER — Encounter: Payer: Self-pay | Admitting: Women's Health

## 2015-08-29 VITALS — BP 130/60 | HR 72 | Wt 180.0 lb

## 2015-08-29 DIAGNOSIS — Z3491 Encounter for supervision of normal pregnancy, unspecified, first trimester: Secondary | ICD-10-CM

## 2015-08-29 DIAGNOSIS — Z369 Encounter for antenatal screening, unspecified: Secondary | ICD-10-CM

## 2015-08-29 DIAGNOSIS — Z1389 Encounter for screening for other disorder: Secondary | ICD-10-CM

## 2015-08-29 DIAGNOSIS — F17200 Nicotine dependence, unspecified, uncomplicated: Secondary | ICD-10-CM

## 2015-08-29 DIAGNOSIS — Z0283 Encounter for blood-alcohol and blood-drug test: Secondary | ICD-10-CM

## 2015-08-29 DIAGNOSIS — F419 Anxiety disorder, unspecified: Secondary | ICD-10-CM

## 2015-08-29 DIAGNOSIS — Z331 Pregnant state, incidental: Secondary | ICD-10-CM

## 2015-08-29 DIAGNOSIS — Z3682 Encounter for antenatal screening for nuchal translucency: Secondary | ICD-10-CM

## 2015-08-29 DIAGNOSIS — Z3490 Encounter for supervision of normal pregnancy, unspecified, unspecified trimester: Secondary | ICD-10-CM | POA: Insufficient documentation

## 2015-08-29 NOTE — Progress Notes (Signed)
  Subjective:  Julia Castro is a 25 y.o. [id number] African American female at [redacted]w[redacted]d by LMP c/w 6wk u/s, being seen today for her first obstetrical visit.  Her obstetrical history is significant for term uncomplicated svb x 2, EAB x 1; smoker- 2.5 black & milds/day prior to pregnancy- now maybe 1 total/wk.  H/O anxiety/panic attacks- no meds, doing well. Pregnancy history fully reviewed.  Patient reports some n/v- declines meds, some heart palpitations at night- doesn't drink much caffeine- no cp or sob. Denies vb, cramping, uti s/s, abnormal/malodorous vag d/c, or vulvovaginal itching/irritation.  BP 130/60 mmHg  Pulse 72  Wt 180 lb (81.647 kg)  LMP 06/29/2015  HISTORY: OB History  Gravida Para Term Preterm AB SAB TAB Ectopic Multiple Living  0 1 0 0 0 0 2    # Outcome Date GA Lbr Len/2nd Weight Sex Delivery Anes PTL Lv  4 Current           3 Term 07/22/11 [redacted]w[redacted]d 24:14 / 00:57 7 lb 4 oz (3.289 kg) F Vag-Spont EPI N Y  2 Term 06/26/09 [redacted]w[redacted]d  6 lb 8 oz (2.948 kg) F Vag-Spont EPI N Y  1 AB              Past Medical History  Diagnosis Date  . Panic attacks   . Heart murmur     Benign  . Chlamydia    Past Surgical History  Procedure Laterality Date  . Wisdom tooth extraction    . Induced abortion  09/2013   Family History  Problem Relation Age of Onset  . Anesthesia problems Neg Hx   . Other Mother     heart issues  . Seizures Father   . Cancer Sister   . Mental retardation Brother   . Mental illness Maternal Grandmother   . Anxiety disorder Maternal Grandmother   . Alcohol abuse Maternal Grandfather     in the past  . Anxiety disorder Paternal Grandmother   . Other Brother     MVA  . SIDS Sister     Exam   System:     General: Well developed & nourished, no acute distress   Skin: Warm & dry, normal coloration and turgor, no rashes   Neurologic: Alert & oriented, normal mood   Cardiovascular: Regular rate & rhythm   Respiratory: Effort & rate normal, LCTAB,  acyanotic   Abdomen: Soft, non tender   Extremities: normal strength, tone  Thin prep pap smear neg 04/10/15    Assessment:   Pregnancy: [id number] Patient Active Problem List   Diagnosis Date Noted  . Supervision of normal pregnancy 08/29/2015    Priority: High  . Smoker 08/29/2015  . Anxiety w/ panic attacks 08/29/2015    [redacted]w[redacted]d [id number] New OB visit N/V of pregnancy Heart palpitations Smoker  Anxiety w/ panic attacks  Plan:  Initial labs drawn Continue prenatal vitamins Problem list reviewed and updated Reviewed n/v relief measures and warning s/s to report Reviewed recommended weight gain based on pre-gravid BMI Encouraged well-balanced diet Genetic Screening discussed Integrated Screen: requested Cystic fibrosis screening discussed declined Ultrasound discussed; fetal survey: requested Follow up in 4 weeks for 1st it/nt and visit CCNC completed Cut out all caffeine/smoking, destress- see if palpitations improve- if worsen or any new sx let us know  Marge Duncans CNM, [facility] 08/29/2015 3:52 PM

## 2015-08-29 NOTE — Patient Instructions (Signed)

## 2015-08-31 LAB — GC/CHLAMYDIA PROBE AMP
CHLAMYDIA, DNA PROBE: NEGATIVE
NEISSERIA GONORRHOEAE BY PCR: NEGATIVE

## 2015-08-31 LAB — URINE CULTURE

## 2015-09-26 ENCOUNTER — Ambulatory Visit (INDEPENDENT_AMBULATORY_CARE_PROVIDER_SITE_OTHER): Payer: Medicaid Other

## 2015-09-26 ENCOUNTER — Encounter: Payer: Self-pay | Admitting: Advanced Practice Midwife

## 2015-09-26 ENCOUNTER — Ambulatory Visit (INDEPENDENT_AMBULATORY_CARE_PROVIDER_SITE_OTHER): Payer: Medicaid Other | Admitting: Advanced Practice Midwife

## 2015-09-26 VITALS — BP 104/58 | HR 72 | Wt 177.0 lb

## 2015-09-26 DIAGNOSIS — Z3A13 13 weeks gestation of pregnancy: Secondary | ICD-10-CM

## 2015-09-26 DIAGNOSIS — Z36 Encounter for antenatal screening of mother: Secondary | ICD-10-CM

## 2015-09-26 DIAGNOSIS — Z3491 Encounter for supervision of normal pregnancy, unspecified, first trimester: Secondary | ICD-10-CM

## 2015-09-26 DIAGNOSIS — Z331 Pregnant state, incidental: Secondary | ICD-10-CM

## 2015-09-26 DIAGNOSIS — Z3682 Encounter for antenatal screening for nuchal translucency: Secondary | ICD-10-CM

## 2015-09-26 DIAGNOSIS — Z1389 Encounter for screening for other disorder: Secondary | ICD-10-CM

## 2015-09-26 LAB — POCT URINALYSIS DIPSTICK
GLUCOSE UA: NEGATIVE
Ketones, UA: NEGATIVE
NITRITE UA: NEGATIVE
RBC UA: NEGATIVE

## 2015-09-26 NOTE — Progress Notes (Signed)
[id number] [redacted]w[redacted]d Estimated Date of Delivery: 04/04/16  Last menstrual period 06/29/2015.   BP weight and urine results all reviewed and noted.  Please refer to the obstetrical flow sheet for the fundal height and fetal heart rate documentation:US 12+5 wks,measurements c/w dates,normal ov's bilat,NT 1.8 mm,NB present,fhr 153 bpm,crl 68.66mm,subchorionic hemorrhage 4.7 x 4.4 x .6cm  Patient denies any bleeding and no rupture of membranes symptoms or regular contractions. Patient is without complaints. All questions were answered.  No orders of the defined types were placed in this encounter.    Plan:  Continued routine obstetrical care, Nt/IT, PN1 today (didn't get it last visit)  Return in about 4 weeks (around 10/24/2015) for LROB, 2nd IT.

## 2015-09-26 NOTE — Progress Notes (Addendum)
[place] 12+5 wks,measurements c/w dates,normal ov's bilat,NT 1.8 mm,NB present,fhr 153 bpm,crl 68.71mm,subchorionic hemorrhage 4.7 x 4.4 x .6cm

## 2015-09-27 LAB — CBC
HEMATOCRIT: 37.6 % (ref 34.0–46.6)
HEMOGLOBIN: 12.4 g/dL (ref 11.1–15.9)
MCH: 28.7 pg (ref 26.6–33.0)
MCHC: 33 g/dL (ref 31.5–35.7)
MCV: 87 fL (ref 79–97)
Platelets: 300 10*3/uL (ref 150–379)
RBC: 4.32 x10E6/uL (ref 3.77–5.28)
RDW: 13.5 % (ref 12.3–15.4)
WBC: 11.5 10*3/uL — AB (ref 3.4–10.8)

## 2015-09-27 LAB — HEPATITIS B SURFACE ANTIGEN: Hepatitis B Surface Ag: NEGATIVE

## 2015-09-27 LAB — HIV ANTIBODY (ROUTINE TESTING W REFLEX): HIV SCREEN 4TH GENERATION: NONREACTIVE

## 2015-09-27 LAB — ANTIBODY SCREEN: Antibody Screen: NEGATIVE

## 2015-09-27 LAB — ABO/RH: RH TYPE: POSITIVE

## 2015-09-27 LAB — VARICELLA ZOSTER ANTIBODY, IGG: Varicella zoster IgG: 599 index (ref >165)

## 2015-09-27 LAB — RPR: RPR: NONREACTIVE

## 2015-09-27 LAB — RUBELLA SCREEN: RUBELLA: 2.5 {index} (ref >0.99)

## 2015-09-27 LAB — SICKLE CELL SCREEN: SICKLE CELL SCREEN: POSITIVE — AB

## 2015-09-28 LAB — MATERNAL SCREEN, INTEGRATED #1
CROWN RUMP LENGTH MAT SCREEN: 68.8 mm
Gest. Age on Collection Date: 12.9 weeks
Maternal Age at EDD: 25 years
NUCHAL TRANSLUCENCY (NT): 1.8 mm
Number of Fetuses: 1
PAPP-A Value: 1670.7 ng/mL
Weight: 177 [lb_av]

## 2015-10-01 ENCOUNTER — Encounter: Payer: Self-pay | Admitting: Women's Health

## 2015-10-01 DIAGNOSIS — D573 Sickle-cell trait: Secondary | ICD-10-CM | POA: Insufficient documentation

## 2015-10-01 LAB — SPECIMEN STATUS REPORT

## 2015-10-03 LAB — HGB FRAC. W/SOLUBILITY
Hgb A2 Quant: 4.1 % — ABNORMAL HIGH (ref 0.7–3.1)
Hgb A: 57.9 % — ABNORMAL LOW (ref 94.0–98.0)
Hgb C: 0 %
Hgb F Quant: 0 % (ref 0.0–2.0)
Hgb S: 38 % — ABNORMAL HIGH
Hgb Solubility: POSITIVE — AB

## 2015-10-03 LAB — SPECIMEN STATUS REPORT

## 2015-10-24 ENCOUNTER — Encounter: Payer: Medicaid Other | Admitting: Women's Health

## 2015-10-25 ENCOUNTER — Ambulatory Visit (INDEPENDENT_AMBULATORY_CARE_PROVIDER_SITE_OTHER): Payer: Medicaid Other | Admitting: Obstetrics & Gynecology

## 2015-10-25 ENCOUNTER — Encounter: Payer: Self-pay | Admitting: Obstetrics & Gynecology

## 2015-10-25 VITALS — BP 120/80 | HR 92 | Wt 180.0 lb

## 2015-10-25 DIAGNOSIS — Z3482 Encounter for supervision of other normal pregnancy, second trimester: Secondary | ICD-10-CM

## 2015-10-25 DIAGNOSIS — Z1389 Encounter for screening for other disorder: Secondary | ICD-10-CM

## 2015-10-25 DIAGNOSIS — Z331 Pregnant state, incidental: Secondary | ICD-10-CM

## 2015-10-25 DIAGNOSIS — Z0283 Encounter for blood-alcohol and blood-drug test: Secondary | ICD-10-CM

## 2015-10-25 NOTE — Progress Notes (Signed)
[id number] Estimated Date of Delivery: 04/04/16  Blood pressure 120/80, pulse 92, weight 180 lb (81.647 kg), last menstrual period 06/29/2015.   BP weight and urine results all reviewed and noted.  Please refer to the obstetrical flow sheet for the fundal height and fetal heart rate documentation:  Patient reports good fetal movement, denies any bleeding and no rupture of membranes symptoms or regular contractions. Patient is without complaints. All questions were answered.  No orders of the defined types were placed in this encounter.    Plan:  Continued routine obstetrical care, 2nd IT today  No Follow-up on file.

## 2015-10-26 LAB — PMP SCREEN PROFILE (10S), URINE
Amphetamine Screen, Ur: NEGATIVE ng/mL
BARBITURATE SCRN UR: NEGATIVE ng/mL
BENZODIAZEPINE SCREEN, URINE: NEGATIVE ng/mL
COCAINE(METAB.) SCREEN, URINE: NEGATIVE ng/mL
Cannabinoids Ur Ql Scn: NEGATIVE ng/mL
Creatinine(Crt), U: 115.5 mg/dL (ref 20.0–300.0)
METHADONE SCREEN, URINE: NEGATIVE ng/mL
Opiate Scrn, Ur: NEGATIVE ng/mL
Oxycodone+Oxymorphone Ur Ql Scn: NEGATIVE ng/mL
PCP Scrn, Ur: NEGATIVE ng/mL
PROPOXYPHENE SCREEN: NEGATIVE ng/mL
Ph of Urine: 7.8 (ref 4.5–8.9)

## 2015-10-27 LAB — MATERNAL SCREEN, INTEGRATED #2
ADSF: 1.86
AFP MoM: 1.1
Alpha-Fetoprotein: 38.1 ng/mL
Crown Rump Length: 68.8 mm
DIA MOM: 1
DIA VALUE: 156.4 pg/mL
ESTRIOL UNCONJUGATED: 1.79 ng/mL
Gest. Age on Collection Date: 12.9 weeks
Gestational Age: 17 weeks
HCG MOM: 2.17
HCG VALUE: 57.1 [IU]/mL
MATERNAL AGE AT EDD: 25 a
NUCHAL TRANSLUCENCY (NT): 1.8 mm
NUCHAL TRANSLUCENCY MOM: 1.06
NUMBER OF FETUSES: 1
PAPP-A MoM: 1.78
PAPP-A Value: 1670.7 ng/mL
Test Results:: NEGATIVE
WEIGHT: 177 [lb_av]
Weight: 177 [lb_av]

## 2015-11-14 ENCOUNTER — Other Ambulatory Visit: Payer: Self-pay | Admitting: Obstetrics & Gynecology

## 2015-11-14 DIAGNOSIS — Z1389 Encounter for screening for other disorder: Secondary | ICD-10-CM

## 2015-11-15 ENCOUNTER — Ambulatory Visit (INDEPENDENT_AMBULATORY_CARE_PROVIDER_SITE_OTHER): Payer: Medicaid Other

## 2015-11-15 ENCOUNTER — Ambulatory Visit (INDEPENDENT_AMBULATORY_CARE_PROVIDER_SITE_OTHER): Payer: Medicaid Other | Admitting: Obstetrics & Gynecology

## 2015-11-15 ENCOUNTER — Encounter: Payer: Self-pay | Admitting: Obstetrics & Gynecology

## 2015-11-15 VITALS — BP 118/68 | HR 64 | Wt 181.0 lb

## 2015-11-15 DIAGNOSIS — Z331 Pregnant state, incidental: Secondary | ICD-10-CM

## 2015-11-15 DIAGNOSIS — Z1389 Encounter for screening for other disorder: Secondary | ICD-10-CM

## 2015-11-15 DIAGNOSIS — Z36 Encounter for antenatal screening of mother: Secondary | ICD-10-CM

## 2015-11-15 DIAGNOSIS — Z3492 Encounter for supervision of normal pregnancy, unspecified, second trimester: Secondary | ICD-10-CM

## 2015-11-15 DIAGNOSIS — Z3482 Encounter for supervision of other normal pregnancy, second trimester: Secondary | ICD-10-CM

## 2015-11-15 LAB — POCT URINALYSIS DIPSTICK
Glucose, UA: NEGATIVE
Ketones, UA: NEGATIVE
Leukocytes, UA: NEGATIVE
Nitrite, UA: NEGATIVE
PROTEIN UA: NEGATIVE
RBC UA: NEGATIVE

## 2015-11-15 NOTE — Progress Notes (Signed)
[id number] 2882w6d Estimated Date of Delivery: 04/04/16  Blood pressure 118/68, pulse 64, weight 181 lb (82.101 kg), last menstrual period 06/29/2015.   BP weight and urine results all reviewed and noted.  Please refer to the obstetrical flow sheet for the fundal height and fetal heart rate documentation:  Patient reports good fetal movement, denies any bleeding and no rupture of membranes symptoms or regular contractions. Patient is without complaints. All questions were answered.  Orders Placed This Encounter  Procedures  . POCT urinalysis dipstick    Plan:  Continued routine obstetrical care, 20 weeks [facility] is [facility]  No Follow-up on file.

## 2015-11-15 NOTE — Progress Notes (Signed)
US 19+6 wks,cephalic,ant fundal pl gr 0,cx 5cm,normal ov's bilat,svp of fluid 4.8cm,fhr 153 bpm,efw 360 g,anatomy complete,no obvious abnormalities seen,measurements c/w dates

## 2015-12-13 ENCOUNTER — Ambulatory Visit (INDEPENDENT_AMBULATORY_CARE_PROVIDER_SITE_OTHER): Payer: Self-pay | Admitting: Advanced Practice Midwife

## 2015-12-13 ENCOUNTER — Encounter: Payer: Self-pay | Admitting: Advanced Practice Midwife

## 2015-12-13 VITALS — BP 112/62 | HR 68 | Wt 181.0 lb

## 2015-12-13 DIAGNOSIS — Z3482 Encounter for supervision of other normal pregnancy, second trimester: Secondary | ICD-10-CM

## 2015-12-13 DIAGNOSIS — D573 Sickle-cell trait: Secondary | ICD-10-CM

## 2015-12-13 DIAGNOSIS — O99332 Smoking (tobacco) complicating pregnancy, second trimester: Secondary | ICD-10-CM

## 2015-12-13 DIAGNOSIS — Z1389 Encounter for screening for other disorder: Secondary | ICD-10-CM

## 2015-12-13 DIAGNOSIS — Z3A24 24 weeks gestation of pregnancy: Secondary | ICD-10-CM

## 2015-12-13 DIAGNOSIS — Z3492 Encounter for supervision of normal pregnancy, unspecified, second trimester: Secondary | ICD-10-CM

## 2015-12-13 DIAGNOSIS — O99342 Other mental disorders complicating pregnancy, second trimester: Secondary | ICD-10-CM

## 2015-12-13 DIAGNOSIS — O99112 Other diseases of the blood and blood-forming organs and certain disorders involving the immune mechanism complicating pregnancy, second trimester: Secondary | ICD-10-CM

## 2015-12-13 DIAGNOSIS — Z331 Pregnant state, incidental: Secondary | ICD-10-CM

## 2015-12-13 LAB — POCT URINALYSIS DIPSTICK
Glucose, UA: NEGATIVE
KETONES UA: NEGATIVE
Leukocytes, UA: NEGATIVE
NITRITE UA: NEGATIVE
PROTEIN UA: NEGATIVE
RBC UA: NEGATIVE

## 2015-12-13 NOTE — Patient Instructions (Signed)

## 2015-12-13 NOTE — Progress Notes (Signed)
[id number] 3467w6d Estimated Date of Delivery: 04/04/16  Blood pressure 112/62, pulse 68, weight 181 lb (82.101 kg), last menstrual period 06/29/2015.   BP weight and urine results all reviewed and noted.  Please refer to the obstetrical flow sheet for the fundal height and fetal heart rate documentation:  Patient reports good fetal movement, denies any bleeding and no rupture of membranes symptoms or regular contractions. Patient is without complaints. All questions were answered.  Orders Placed This Encounter  Procedures  . Urine culture  . POCT urinalysis dipstick    Plan:  Continued routine obstetrical care,   Return in about 4 weeks (around 01/10/2016) for PN2/LROB.

## 2015-12-15 LAB — URINE CULTURE: Organism ID, Bacteria: NO GROWTH

## 2016-01-09 ENCOUNTER — Ambulatory Visit (INDEPENDENT_AMBULATORY_CARE_PROVIDER_SITE_OTHER): Payer: Medicaid Other | Admitting: Women's Health

## 2016-01-09 ENCOUNTER — Other Ambulatory Visit: Payer: Medicaid Other

## 2016-01-09 VITALS — BP 108/52 | HR 80 | Wt 184.0 lb

## 2016-01-09 DIAGNOSIS — O26892 Other specified pregnancy related conditions, second trimester: Secondary | ICD-10-CM | POA: Diagnosis not present

## 2016-01-09 DIAGNOSIS — F5089 Other specified eating disorder: Secondary | ICD-10-CM

## 2016-01-09 DIAGNOSIS — N898 Other specified noninflammatory disorders of vagina: Secondary | ICD-10-CM | POA: Diagnosis not present

## 2016-01-09 DIAGNOSIS — Z131 Encounter for screening for diabetes mellitus: Secondary | ICD-10-CM

## 2016-01-09 DIAGNOSIS — D573 Sickle-cell trait: Secondary | ICD-10-CM

## 2016-01-09 DIAGNOSIS — Z369 Encounter for antenatal screening, unspecified: Secondary | ICD-10-CM

## 2016-01-09 DIAGNOSIS — Z3492 Encounter for supervision of normal pregnancy, unspecified, second trimester: Secondary | ICD-10-CM

## 2016-01-09 LAB — POCT WET PREP (WET MOUNT): Clue Cells Wet Prep Whiff POC: NEGATIVE

## 2016-01-09 NOTE — Addendum Note (Signed)
Addended by: Shawna ClampBOOKER, Schyler Butikofer R on: 01/09/2016 10:06 AM   Modules accepted: Orders

## 2016-01-09 NOTE — Patient Instructions (Addendum)
[facility] [phone] Mon-Fri 8a-5p (closed daily from 12:30-1 for lunch) Sat 8a-12p)  Monistat 7 for yeast  Call the office [phone]) or go to [facility] if:  You begin to have strong, frequent contractions  Your water breaks.  Sometimes it is a big gush of fluid, sometimes it is just a trickle that keeps getting your panties wet or running down your legs  You have vaginal bleeding.  It is normal to have a small amount of spotting if your cervix was checked.   You don't feel your baby moving like normal.  If you don't, get you something to eat and drink and lay down and focus on feeling your baby move.  You should feel at least 10 movements in 2 hours.  If you don't, you should call the office or go to [facility].    Tdap Vaccine  It is recommended that you get the Tdap vaccine during the third trimester of EACH pregnancy to help protect your baby from getting pertussis (whooping cough)  27-36 weeks is the BEST time to do this so that you can pass the protection on to your baby. During pregnancy is better than after pregnancy, but if you are unable to get it during pregnancy it will be offered at the hospital.   You can get this vaccine at the health department or your family doctor  Everyone who will be around your baby should also be up-to-date on their vaccines. Adults (who are not pregnant) only need 1 dose of Tdap during adulthood.   Third Trimester of Pregnancy The third trimester is from week 29 through week 42, months 7 through 9. The third trimester is a time when the fetus is growing rapidly. At the end of the ninth month, the fetus is about 20 inches in length and weighs 6-10 pounds.  BODY CHANGES Your body goes through many changes during pregnancy. The changes vary from woman to woman.   Your weight will continue to increase. You can expect to gain 25-35 pounds (11-16 kg) by the end of the pregnancy.  You may begin to get stretch marks on your hips, abdomen,  and breasts.  You may urinate more often because the fetus is moving lower into your pelvis and pressing on your bladder.  You may develop or continue to have heartburn as a result of your pregnancy.  You may develop constipation because certain hormones are causing the muscles that push waste through your intestines to slow down.  You may develop hemorrhoids or swollen, bulging veins (varicose veins).  You may have pelvic pain because of the weight gain and pregnancy hormones relaxing your joints between the bones in your pelvis. Backaches may result from overexertion of the muscles supporting your posture.  You may have changes in your hair. These can include thickening of your hair, rapid growth, and changes in texture. Some women also have hair loss during or after pregnancy, or hair that feels dry or thin. Your hair will most likely return to normal after your baby is born.  Your breasts will continue to grow and be tender. A yellow discharge may leak from your breasts called colostrum.  Your belly button may stick out.  You may feel short of breath because of your expanding uterus.  You may notice the fetus "dropping," or moving lower in your abdomen.  You may have a bloody mucus discharge. This usually occurs a few days to a week before labor begins.  Your cervix becomes thin and  soft (effaced) near your due date. WHAT TO EXPECT AT YOUR PRENATAL EXAMS  You will have prenatal exams every 2 weeks until week 36. Then, you will have weekly prenatal exams. During a routine prenatal visit:  You will be weighed to make sure you and the fetus are growing normally.  Your blood pressure is taken.  Your abdomen will be measured to track your baby's growth.  The fetal heartbeat will be listened to.  Any test results from the previous visit will be discussed.  You may have a cervical check near your due date to see if you have effaced. At around 36 weeks, your caregiver will check  your cervix. At the same time, your caregiver will also perform a test on the secretions of the vaginal tissue. This test is to determine if a type of bacteria, Group B streptococcus, is present. Your caregiver will explain this further. Your caregiver may ask you:  What your birth plan is.  How you are feeling.  If you are feeling the baby move.  If you have had any abnormal symptoms, such as leaking fluid, bleeding, severe headaches, or abdominal cramping.  If you have any questions. Other tests or screenings that may be performed during your third trimester include:  Blood tests that check for low iron levels (anemia).  Fetal testing to check the health, activity level, and growth of the fetus. Testing is done if you have certain medical conditions or if there are problems during the pregnancy. FALSE LABOR You may feel small, irregular contractions that eventually go away. These are called Braxton Hicks contractions, or false labor. Contractions may last for hours, days, or even weeks before true labor sets in. If contractions come at regular intervals, intensify, or become painful, it is best to be seen by your caregiver.  SIGNS OF LABOR   Menstrual-like cramps.  Contractions that are 5 minutes apart or less.  Contractions that start on the top of the uterus and spread down to the lower abdomen and back.  A sense of increased pelvic pressure or back pain.  A watery or bloody mucus discharge that comes from the vagina. If you have any of these signs before the 37th week of pregnancy, call your caregiver right away. You need to go to the hospital to get checked immediately. HOME CARE INSTRUCTIONS   Avoid all smoking, herbs, alcohol, and unprescribed drugs. These chemicals affect the formation and growth of the baby.  Follow your caregiver's instructions regarding medicine use. There are medicines that are either safe or unsafe to take during pregnancy.  Exercise only as  directed by your caregiver. Experiencing uterine cramps is a good sign to stop exercising.  Continue to eat regular, healthy meals.  Wear a good support bra for breast tenderness.  Do not use hot tubs, steam rooms, or saunas.  Wear your seat belt at all times when driving.  Avoid raw meat, uncooked cheese, cat litter boxes, and soil used by cats. These carry germs that can cause birth defects in the baby.  Take your prenatal vitamins.  Try taking a stool softener (if your caregiver approves) if you develop constipation. Eat more high-fiber foods, such as fresh vegetables or fruit and whole grains. Drink plenty of fluids to keep your urine clear or pale yellow.  Take warm sitz baths to soothe any pain or discomfort caused by hemorrhoids. Use hemorrhoid cream if your caregiver approves.  If you develop varicose veins, wear support hose. Elevate your feet for  15 minutes, 3-4 times a day. Limit salt in your diet.  Avoid heavy lifting, wear low heal shoes, and practice good posture.  Rest a lot with your legs elevated if you have leg cramps or low back pain.  Visit your dentist if you have not gone during your pregnancy. Use a soft toothbrush to brush your teeth and be gentle when you floss.  A sexual relationship may be continued unless your caregiver directs you otherwise.  Do not travel far distances unless it is absolutely necessary and only with the approval of your caregiver.  Take prenatal classes to understand, practice, and ask questions about the labor and delivery.  Make a trial run to the hospital.  Pack your hospital bag.  Prepare the baby's nursery.  Continue to go to all your prenatal visits as directed by your caregiver. SEEK MEDICAL CARE IF:  You are unsure if you are in labor or if your water has broken.  You have dizziness.  You have mild pelvic cramps, pelvic pressure, or nagging pain in your abdominal area.  You have persistent nausea, vomiting, or  diarrhea.  You have a bad smelling vaginal discharge.  You have pain with urination. SEEK IMMEDIATE MEDICAL CARE IF:   You have a fever.  You are leaking fluid from your vagina.  You have spotting or bleeding from your vagina.  You have severe abdominal cramping or pain.  You have rapid weight loss or gain.  You have shortness of breath with chest pain.  You notice sudden or extreme swelling of your face, hands, ankles, feet, or legs.  You have not felt your baby move in over an hour.  You have severe headaches that do not go away with medicine.  You have vision changes. Document Released: 07/15/2001 Document Revised: 07/26/2013 Document Reviewed: 09/21/2012 [facility] Patient Information 2015 [employer], [place]. This information is not intended to replace advice given to you by your health care provider. Make sure you discuss any questions you have with your health care provider.

## 2016-01-09 NOTE — Progress Notes (Addendum)
Low-risk OB appointment [id number] Estimated Date of Delivery: 04/04/16 BP 108/52 mmHg  Pulse 80  Wt 184 lb (83.462 kg)  LMP 06/29/2015  BP, weight, and urine reviewed.  Refer to obstetrical flow sheet for FH & FHR.  Reports good fm.  Denies regular uc's, lof, vb, or uti s/s. Fatigued, eating ice, whole container of cornstarch q 2 days- only 1 meal/day- checking hgb today w/ pn2. Try to decrease cornstarch, incorporate food. [facility] [facility] [place] ok if no ptl sx and ok w/ airline- discussed increased r/f DVT/PE- to get up and move around often, no crossing legs, pump feet up and down to help w/ circulation, take records.  Vaginal d/c w/ irritation x 1 week.  Spec exam: cx visually closed, mod amt thick white nonodorous d/c, wet prep few yeast- to use otc monistat 7 Reviewed ptl s/s, fkc. Recommended Tdap at HD/PCP per CDC guidelines.  Plan:  Continue routine obstetrical care  F/U in 3wks for OB appointment  PN2 today

## 2016-01-10 LAB — CBC
HEMATOCRIT: 33.1 % — AB (ref 34.0–46.6)
HEMOGLOBIN: 11 g/dL — AB (ref 11.1–15.9)
MCH: 28.5 pg (ref 26.6–33.0)
MCHC: 33.2 g/dL (ref 31.5–35.7)
MCV: 86 fL (ref 79–97)
Platelets: 272 10*3/uL (ref 150–379)
RBC: 3.86 x10E6/uL (ref 3.77–5.28)
RDW: 12.8 % (ref 12.3–15.4)
WBC: 14.6 10*3/uL — ABNORMAL HIGH (ref 3.4–10.8)

## 2016-01-10 LAB — RPR: RPR: NONREACTIVE

## 2016-01-10 LAB — GLUCOSE TOLERANCE, 2 HOURS W/ 1HR
GLUCOSE, 2 HOUR: 78 mg/dL (ref 65–152)
Glucose, 1 hour: 106 mg/dL (ref 65–179)
Glucose, Fasting: 85 mg/dL (ref 65–91)

## 2016-01-10 LAB — ANTIBODY SCREEN: ANTIBODY SCREEN: NEGATIVE

## 2016-01-10 LAB — HIV ANTIBODY (ROUTINE TESTING W REFLEX): HIV SCREEN 4TH GENERATION: NONREACTIVE

## 2016-01-30 ENCOUNTER — Encounter: Payer: Medicaid Other | Admitting: Advanced Practice Midwife

## 2016-01-31 ENCOUNTER — Encounter: Payer: Self-pay | Admitting: Advanced Practice Midwife

## 2016-01-31 ENCOUNTER — Ambulatory Visit (INDEPENDENT_AMBULATORY_CARE_PROVIDER_SITE_OTHER): Payer: Medicaid Other | Admitting: Advanced Practice Midwife

## 2016-01-31 VITALS — BP 110/60 | HR 93 | Wt 184.0 lb

## 2016-01-31 DIAGNOSIS — Z3A31 31 weeks gestation of pregnancy: Secondary | ICD-10-CM

## 2016-01-31 DIAGNOSIS — Z3483 Encounter for supervision of other normal pregnancy, third trimester: Secondary | ICD-10-CM

## 2016-01-31 DIAGNOSIS — Z331 Pregnant state, incidental: Secondary | ICD-10-CM

## 2016-01-31 DIAGNOSIS — O99333 Smoking (tobacco) complicating pregnancy, third trimester: Secondary | ICD-10-CM

## 2016-01-31 DIAGNOSIS — F17200 Nicotine dependence, unspecified, uncomplicated: Secondary | ICD-10-CM

## 2016-01-31 DIAGNOSIS — Z3493 Encounter for supervision of normal pregnancy, unspecified, third trimester: Secondary | ICD-10-CM

## 2016-01-31 DIAGNOSIS — Z1389 Encounter for screening for other disorder: Secondary | ICD-10-CM

## 2016-01-31 LAB — POCT URINALYSIS DIPSTICK
Glucose, UA: NEGATIVE
KETONES UA: NEGATIVE
LEUKOCYTES UA: NEGATIVE
Nitrite, UA: NEGATIVE
RBC UA: NEGATIVE

## 2016-01-31 NOTE — Progress Notes (Signed)
[id number] Estimated Date of Delivery: 04/04/16  Blood pressure 110/60, pulse 93, weight 184 lb (83.462 kg), last menstrual period 06/29/2015.   BP weight and urine results all reviewed and noted.  Please refer to the obstetrical flow sheet for the fundal height and fetal heart rate documentation:  Patient reports good fetal movement, denies any bleeding and no rupture of membranes symptoms or regular contractions. Patient is without complaints.  Eats 1/2 box of cornstarch a day and crunches ice.  Hgb 11.   All questions were answered.  Orders Placed This Encounter  Procedures  . POCT urinalysis dipstick    Plan:  Continued routine obstetrical care,   Return in about 2 weeks (around 02/14/2016) for LROB.

## 2016-01-31 NOTE — Patient Instructions (Signed)
Contraception Choices Contraception (birth control) is the use of any methods or devices to prevent pregnancy. Below are some methods to help avoid pregnancy. HORMONAL METHODS   Contraceptive implant. This is a thin, plastic tube containing progesterone hormone. It does not contain estrogen hormone. Your health care provider inserts the tube in the inner part of the upper arm. The tube can remain in place for up to 3 years. After 3 years, the implant must be removed. The implant prevents the ovaries from releasing an egg (ovulation), thickens the cervical mucus to prevent sperm from entering the uterus, and thins the lining of the inside of the uterus.  Progesterone-only injections. These injections are given every 3 months by your health care provider to prevent pregnancy. This synthetic progesterone hormone stops the ovaries from releasing eggs. It also thickens cervical mucus and changes the uterine lining. This makes it harder for sperm to survive in the uterus.  Birth control pills. These pills contain estrogen and progesterone hormone. They work by preventing the ovaries from releasing eggs (ovulation). They also cause the cervical mucus to thicken, preventing the sperm from entering the uterus. Birth control pills are prescribed by a health care provider.Birth control pills can also be used to treat heavy periods.  Minipill. This type of birth control pill contains only the progesterone hormone. They are taken every day of each month and must be prescribed by your health care provider.  Birth control patch. The patch contains hormones similar to those in birth control pills. It must be changed once a week and is prescribed by a health care provider.  Vaginal ring. The ring contains hormones similar to those in birth control pills. It is left in the vagina for 3 weeks, removed for 1 week, and then a new one is put back in place. The patient must be comfortable inserting and removing the ring  from the vagina.A health care provider's prescription is necessary.  Emergency contraception. Emergency contraceptives prevent pregnancy after unprotected sexual intercourse. This pill can be taken right after sex or up to 5 days after unprotected sex. It is most effective the sooner you take the pills after having sexual intercourse. Most emergency contraceptive pills are available without a prescription. Check with your pharmacist. Do not use emergency contraception as your only form of birth control. BARRIER METHODS   Female condom. This is a thin sheath (latex or rubber) that is worn over the penis during sexual intercourse. It can be used with spermicide to increase effectiveness.  Female condom. This is a soft, loose-fitting sheath that is put into the vagina before sexual intercourse.  Diaphragm. This is a soft, latex, dome-shaped barrier that must be fitted by a health care provider. It is inserted into the vagina, along with a spermicidal jelly. It is inserted before intercourse. The diaphragm should be left in the vagina for 6 to 8 hours after intercourse.  Cervical cap. This is a round, soft, latex or plastic cup that fits over the cervix and must be fitted by a health care provider. The cap can be left in place for up to 48 hours after intercourse.  Sponge. This is a soft, circular piece of polyurethane foam. The sponge has spermicide in it. It is inserted into the vagina after wetting it and before sexual intercourse.  Spermicides. These are chemicals that kill or block sperm from entering the cervix and uterus. They come in the form of creams, jellies, suppositories, foam, or tablets. They do not require a   prescription. They are inserted into the vagina with an applicator before having sexual intercourse. The process must be repeated every time you have sexual intercourse. INTRAUTERINE CONTRACEPTION  Intrauterine device (IUD). This is a T-shaped device that is put in a woman's uterus  during a menstrual period to prevent pregnancy. There are 2 types:  Copper IUD. This type of IUD is wrapped in copper wire and is placed inside the uterus. Copper makes the uterus and fallopian tubes produce a fluid that kills sperm. It can stay in place for 10 years.  Hormone IUD. This type of IUD contains the hormone progestin (synthetic progesterone). The hormone thickens the cervical mucus and prevents sperm from entering the uterus, and it also thins the uterine lining to prevent implantation of a fertilized egg. The hormone can weaken or kill the sperm that get into the uterus. It can stay in place for 3-5 years, depending on which type of IUD is used. PERMANENT METHODS OF CONTRACEPTION  Female tubal ligation. This is when the woman's fallopian tubes are surgically sealed, tied, or blocked to prevent the egg from traveling to the uterus.  Hysteroscopic sterilization. This involves placing a small coil or insert into each fallopian tube. Your doctor uses a technique called hysteroscopy to do the procedure. The device causes scar tissue to form. This results in permanent blockage of the fallopian tubes, so the sperm cannot fertilize the egg. It takes about 3 months after the procedure for the tubes to become blocked. You must use another form of birth control for these 3 months.  Female sterilization. This is when the female has the tubes that carry sperm tied off (vasectomy).This blocks sperm from entering the vagina during sexual intercourse. After the procedure, the man can still ejaculate fluid (semen). NATURAL PLANNING METHODS  Natural family planning. This is not having sexual intercourse or using a barrier method (condom, diaphragm, cervical cap) on days the woman could become pregnant.  Calendar method. This is keeping track of the length of each menstrual cycle and identifying when you are fertile.  Ovulation method. This is avoiding sexual intercourse during ovulation.  Symptothermal  method. This is avoiding sexual intercourse during ovulation, using a thermometer and ovulation symptoms.  Post-ovulation method. This is timing sexual intercourse after you have ovulated. Regardless of which type or method of contraception you choose, it is important that you use condoms to protect against the transmission of sexually transmitted infections (STIs). Talk with your health care provider about which form of contraception is most appropriate for you.   This information is not intended to replace advice given to you by your health care provider. Make sure you discuss any questions you have with your health care provider.   Document Released: 07/21/2005 Document Revised: 07/26/2013 Document Reviewed: 01/13/2013 Elsevier Interactive Patient Education 2016 [employer]  

## 2016-01-31 NOTE — Progress Notes (Signed)
Pt denies any problems or concerns at this time.  

## 2016-02-14 ENCOUNTER — Ambulatory Visit (INDEPENDENT_AMBULATORY_CARE_PROVIDER_SITE_OTHER): Payer: Medicaid Other | Admitting: Advanced Practice Midwife

## 2016-02-14 VITALS — BP 122/66 | HR 86 | Wt 186.0 lb

## 2016-02-14 DIAGNOSIS — Z331 Pregnant state, incidental: Secondary | ICD-10-CM

## 2016-02-14 DIAGNOSIS — Z3A33 33 weeks gestation of pregnancy: Secondary | ICD-10-CM

## 2016-02-14 DIAGNOSIS — Z3493 Encounter for supervision of normal pregnancy, unspecified, third trimester: Secondary | ICD-10-CM

## 2016-02-14 DIAGNOSIS — Z3483 Encounter for supervision of other normal pregnancy, third trimester: Secondary | ICD-10-CM

## 2016-02-14 DIAGNOSIS — Z1389 Encounter for screening for other disorder: Secondary | ICD-10-CM

## 2016-02-14 LAB — POCT URINALYSIS DIPSTICK
Glucose, UA: NEGATIVE
KETONES UA: NEGATIVE
Leukocytes, UA: NEGATIVE
Nitrite, UA: NEGATIVE
RBC UA: NEGATIVE

## 2016-02-14 NOTE — Progress Notes (Signed)
[id number] Estimated Date of Delivery: 04/04/16  Blood pressure 122/66, pulse 86, weight 186 lb (84.369 kg), last menstrual period 06/29/2015.   BP weight and urine results all reviewed and noted.  Please refer to the obstetrical flow sheet for the fundal height and fetal heart rate documentation:  Patient reports good fetal movement, denies any bleeding and no rupture of membranes symptoms or regular contractions. Patient is without complaints. All questions were answered.  Orders Placed This Encounter  Procedures  . POCT urinalysis dipstick    Plan:  Continued routine obstetrical care,   Return in about 2 weeks (around 02/28/2016) for LROB.

## 2016-02-23 ENCOUNTER — Emergency Department (HOSPITAL_COMMUNITY)
Admission: EM | Admit: 2016-02-23 | Discharge: 2016-02-23 | Disposition: A | Discharge status: Home / Self Care | Payer: Medicaid Other | Attending: Emergency Medicine | Admitting: Emergency Medicine

## 2016-02-23 ENCOUNTER — Encounter (HOSPITAL_COMMUNITY): Payer: Self-pay

## 2016-02-23 DIAGNOSIS — R0789 Other chest pain: Secondary | ICD-10-CM | POA: Diagnosis not present

## 2016-02-23 DIAGNOSIS — Z87891 Personal history of nicotine dependence: Secondary | ICD-10-CM | POA: Insufficient documentation

## 2016-02-23 DIAGNOSIS — R42 Dizziness and giddiness: Secondary | ICD-10-CM | POA: Diagnosis present

## 2016-02-23 DIAGNOSIS — Z3A34 34 weeks gestation of pregnancy: Secondary | ICD-10-CM | POA: Insufficient documentation

## 2016-02-23 DIAGNOSIS — O26893 Other specified pregnancy related conditions, third trimester: Secondary | ICD-10-CM | POA: Insufficient documentation

## 2016-02-23 MED ORDER — PREDNISONE 50 MG PO TABS
60.0000 mg | ORAL_TABLET | Freq: Once | ORAL | Status: AC
  Administered 2016-02-23: 60 mg via ORAL
  Filled 2016-02-23: qty 1

## 2016-02-23 NOTE — Progress Notes (Signed)
Fetal monitoring reviewed, no significant uterine activity noted. FHR in normal range, with adequate variability noted.

## 2016-02-23 NOTE — ED Notes (Signed)
Pt describes a dull pressure type of pain in the center of her chest that started approx 2 hours ago, states she was riding in the car when it started, seems to be worse with deep breath

## 2016-02-23 NOTE — Progress Notes (Signed)
Requested fetal monitoring adjustment.

## 2016-02-23 NOTE — Discharge Instructions (Signed)
Use ice or heat on your chest. Dr Emelda Fear states you can take acetaminophen 650 mg 4 times a day for pain as needed. Keep your appointments, recheck sooner if you get abdominal pain.    Chest Wall Pain Chest wall pain is pain in or around the bones and muscles of your chest. Sometimes, an injury causes this pain. Sometimes, the cause may not be known. This pain may take several weeks or longer to get better. HOME CARE INSTRUCTIONS  Pay attention to any changes in your symptoms. Take these actions to help with your pain:   Rest as told by your health care provider.   Avoid activities that cause pain. These include any activities that use your chest muscles or your abdominal and side muscles to lift heavy items.   If directed, apply ice to the painful area:  Put ice in a plastic bag.  Place a towel between your skin and the bag.  Leave the ice on for 20 minutes, 2-3 times per day.  Take over-the-counter and prescription medicines only as told by your health care provider.  Do not use tobacco products, including cigarettes, chewing tobacco, and e-cigarettes. If you need help quitting, ask your health care provider.  Keep all follow-up visits as told by your health care provider. This is important. SEEK MEDICAL CARE IF:  You have a fever.  Your chest pain becomes worse.  You have new symptoms. SEEK IMMEDIATE MEDICAL CARE IF:  You have nausea or vomiting.  You feel sweaty or light-headed.  You have a cough with phlegm (sputum) or you cough up blood.  You develop shortness of breath.   This information is not intended to replace advice given to you by your health care provider. Make sure you discuss any questions you have with your health care provider.   Document Released: 07/21/2005 Document Revised: 04/11/2015 Document Reviewed: 10/16/2014 Elsevier Interactive Patient Education [employer]

## 2016-02-23 NOTE — ED Provider Notes (Signed)
CSN: [id number]     Arrival date & time 02/23/16  0204 History   First MD Initiated Contact with Patient 02/23/16 02:30  AM   Chief Complaint  Patient presents with  . Chest Pain     (Consider location/radiation/quality/duration/timing/severity/associated sxs/prior Treatment) HPI patient is G4 P2 Ab1, [redacted] weeks pregnant. [facility] She states she's had a normal pregnancy up to date. She states about 45 minutes ago she was sitting and started having pain just to the right at the center of her chest that she describes as being dull and a pressure-like feeling. She denies feeling shortness of breath or having any coughing. She denies fever, nausea, or vomiting. The pain does not radiate. She denies any change in her activity.  Patient also states for the past several weeks she's been feeling lightheaded when she stands up for a period of time. Her OB doctor is aware of this and told her to standup slower.    OB [facility]  Past Medical History  Diagnosis Date  . Panic attacks   . Heart murmur     Benign  . Chlamydia    Past Surgical History  Procedure Laterality Date  . Wisdom tooth extraction    . Induced abortion  09/2013   Family History  Problem Relation Age of Onset  . Anesthesia problems Neg Hx   . Other Mother     heart issues  . Seizures Father   . Cancer Sister   . Mental retardation Brother   . Mental illness Maternal Grandmother   . Anxiety disorder Maternal Grandmother   . Alcohol abuse Maternal Grandfather     in the past  . Anxiety disorder Paternal Grandmother   . Other Brother     MVA  . SIDS Sister    Social History  Substance Use Topics  . Smoking status: Former Smoker -- 3 years    Types: Cigars  . Smokeless tobacco: Never Used     Comment: smokes 1 daily  . Alcohol Use: No   Self-employed  OB History    Gravida Para Term Preterm AB TAB SAB Ectopic Multiple Living   4 2 2  0 1 0 0 0 0 2     Review of  Systems  All other systems reviewed and are negative.     Allergies  Review of patient's allergies indicates no known allergies.  Home Medications   Prior to Admission medications   Medication Sig Start Date End Date Taking? Authorizing Provider  flintstones complete (FLINTSTONES) 60 MG chewable tablet Chew by mouth daily. Reported on 01/09/2016    Historical Provider, MD   BP 119/59 mmHg  Pulse 81  Temp(Src) 98.3 F (36.8 C) (Oral)  Resp 16  Ht 5\' 1"  (1.549 m)  Wt 186 lb (84.369 kg)  BMI 35.16 kg/m2  SpO2 100%  LMP 06/29/2015  Vital signs normal   Physical Exam  Constitutional: She is oriented to person, place, and time. She appears well-developed and well-nourished.  Non-toxic appearance. She does not appear ill. No distress.  HENT:  Head: Normocephalic and atraumatic.  Right Ear: External ear normal.  Left Ear: External ear normal.  Nose: Nose normal. No mucosal edema or rhinorrhea.  Mouth/Throat: Oropharynx is clear and moist and mucous membranes are normal. No dental abscesses or uvula swelling.  Eyes: Conjunctivae and EOM are normal. Pupils are equal, round, and reactive to light.  Neck: Normal range of motion and full passive range of  motion without pain. Neck supple.  Cardiovascular: Normal rate, regular rhythm and normal heart sounds.  Exam reveals no gallop and no friction rub.   No murmur heard. Pulmonary/Chest: Effort normal and breath sounds normal. No respiratory distress. She has no wheezes. She has no rhonchi. She has no rales. She exhibits tenderness. She exhibits no crepitus.    Very tender to palpation over the right costochondral junctions that reproduces her complaints of pain  Abdominal: Soft. Normal appearance and bowel sounds are normal. She exhibits no distension. There is no tenderness. There is no rebound and no guarding.  Abdomen consistent with dates. She has active fetal movements. Her fetal heart rate is in the 140s  Musculoskeletal: Normal  range of motion. She exhibits no edema or tenderness.  Moves all extremities well.   Neurological: She is alert and oriented to person, place, and time. She has normal strength. No cranial nerve deficit.  Skin: Skin is warm, dry and intact. No rash noted. No erythema. No pallor.  Psychiatric: She has a normal mood and affect. Her speech is normal and behavior is normal. Her mood appears not anxious.  Nursing note and vitals reviewed.   ED Course  Procedures (including critical care time)  Patient was placed on the fetal monitor.  Filed: 02/23/2016 4:15 AM Note Time: 02/23/2016 4:13 AM Status: Incomplete    Editor: Tilda Burrow, MD (Physician)     Expand All Collapse All   Fetal monitoring reviewed, no significant uterine activity noted. FHR in normal range, with adequate variability noted.       04:40 AM Dr Emelda Fear, states to have her use heat and take acetaminophen for pain.      EKG Interpretation   Date/Time:  Saturday February 23 2016 02:15:05 EDT Ventricular Rate:  70 PR Interval:    QRS Duration: 85 QT Interval:  403 QTC Calculation: 435 R Axis:   52 Text Interpretation:  Sinus rhythm Abnormal Q suggests inferior infarct  Baseline wander in lead(s) II No old tracing to compare Confirmed by Mabrey Howland   MD-I, Amadu Schlageter ([id number]) on 02/23/2016 2:30:58 AM      MDM   Final diagnoses:  Chest wall pain    meds OTC acetaminophen  Plan discharge  Devoria Albe, MD, Concha Pyo, MD 02/23/16 [id number]

## 2016-02-28 ENCOUNTER — Ambulatory Visit (INDEPENDENT_AMBULATORY_CARE_PROVIDER_SITE_OTHER): Payer: Medicaid Other | Admitting: Advanced Practice Midwife

## 2016-02-28 ENCOUNTER — Encounter: Payer: Self-pay | Admitting: Advanced Practice Midwife

## 2016-02-28 VITALS — BP 114/60 | HR 100 | Wt 187.0 lb

## 2016-02-28 DIAGNOSIS — Z3483 Encounter for supervision of other normal pregnancy, third trimester: Secondary | ICD-10-CM

## 2016-02-28 DIAGNOSIS — Z3A35 35 weeks gestation of pregnancy: Secondary | ICD-10-CM

## 2016-02-28 DIAGNOSIS — Z331 Pregnant state, incidental: Secondary | ICD-10-CM

## 2016-02-28 DIAGNOSIS — Z3493 Encounter for supervision of normal pregnancy, unspecified, third trimester: Secondary | ICD-10-CM

## 2016-02-28 DIAGNOSIS — Z1389 Encounter for screening for other disorder: Secondary | ICD-10-CM

## 2016-02-28 DIAGNOSIS — O26843 Uterine size-date discrepancy, third trimester: Secondary | ICD-10-CM

## 2016-02-28 LAB — POCT URINALYSIS DIPSTICK
GLUCOSE UA: NEGATIVE
Ketones, UA: NEGATIVE
Leukocytes, UA: NEGATIVE
NITRITE UA: NEGATIVE
Protein, UA: NEGATIVE
RBC UA: NEGATIVE

## 2016-02-28 NOTE — Progress Notes (Signed)
Pt denies any problems or concerns at this time.  

## 2016-02-28 NOTE — Patient Instructions (Signed)

## 2016-02-28 NOTE — Progress Notes (Signed)
[id number] [redacted]w[redacted]d Estimated Date of Delivery: 04/04/16  Blood pressure 114/60, pulse 100, weight 187 lb (84.8 kg), last menstrual period 06/29/2015.   BP weight and urine results all reviewed and noted.  Please refer to the obstetrical flow sheet for the fundal height and fetal heart rate documentation:size a little > dates  Patient reports good fetal movement, denies any bleeding and no rupture of membranes symptoms or regular contractions. Patient is without complaints. All questions were answered.  Orders Placed This Encounter  Procedures  . POCT urinalysis dipstick    Plan:  Continued routine obstetrical care,   Return in about 2 weeks (around 03/13/2016) for LROB.

## 2016-03-07 ENCOUNTER — Other Ambulatory Visit: Payer: Medicaid Other

## 2016-03-10 ENCOUNTER — Encounter: Payer: Self-pay | Admitting: Obstetrics and Gynecology

## 2016-03-10 ENCOUNTER — Ambulatory Visit (INDEPENDENT_AMBULATORY_CARE_PROVIDER_SITE_OTHER): Payer: Medicaid Other

## 2016-03-10 ENCOUNTER — Ambulatory Visit (INDEPENDENT_AMBULATORY_CARE_PROVIDER_SITE_OTHER): Payer: Medicaid Other | Admitting: Obstetrics and Gynecology

## 2016-03-10 VITALS — BP 100/70 | HR 85 | Wt 188.5 lb

## 2016-03-10 DIAGNOSIS — O26843 Uterine size-date discrepancy, third trimester: Secondary | ICD-10-CM | POA: Diagnosis not present

## 2016-03-10 DIAGNOSIS — Z3492 Encounter for supervision of normal pregnancy, unspecified, second trimester: Secondary | ICD-10-CM

## 2016-03-10 DIAGNOSIS — F419 Anxiety disorder, unspecified: Secondary | ICD-10-CM

## 2016-03-10 DIAGNOSIS — Z3A37 37 weeks gestation of pregnancy: Secondary | ICD-10-CM | POA: Diagnosis not present

## 2016-03-10 DIAGNOSIS — Z3493 Encounter for supervision of normal pregnancy, unspecified, third trimester: Secondary | ICD-10-CM

## 2016-03-10 NOTE — Progress Notes (Signed)
US 36+3 wks,cephalic,ant pl gr 3,fhr 153 bpm,afi 16.2 cm,efw 3122 g 64%,normal ov's bilat,afi 16.2 cm

## 2016-03-10 NOTE — Progress Notes (Signed)
Pt denies any problems or concerns at this time.  

## 2016-03-10 NOTE — Progress Notes (Addendum)
Patient ID: Julia Castro, female   DOB: 09/08/1990, 25 y.o.   MRN: [id number]  [id number] 3825w3d Estimated Date of Delivery: 04/04/16 LROB  Patient reports good fetal movement, denies any bleeding and no rupture of membranes symptoms or regular contractions. Patient complaints: none. Had u/s due to s>d  Blood pressure 100/70, pulse 85, weight 188 lb 8 oz (85.5 kg), last menstrual period 06/29/2015.  refer to the ob flow sheet for FH and FHR, also BP, Wt, Urine results:notable for none                          Physical Examination: General appearance - alert, well appearing, and in no distress                                      Abdomen - FH 38 cm,                                                         -FHR 153 bpm                                                         soft, nontender, nondistended, no masses or organomegaly                                             Questions were answered. Assessment: LROB [id number] @ 8325w3d  Fetal US today normal growth Sickle ccel traint Plan:  Continued routine obstetrical care,   F/u in 1 week for routine prenatal care    By signing my name below, I, Doreatha MartinEva Mathews, attest that this documentation has been prepared under the direction and in the presence of Tilda BurrowJohn V Elgar Scoggins, MD. Electronically Signed: Doreatha MartinEva Mathews, ED Scribe. 03/10/16. 5:16 PM.  I personally performed the services described in this documentation, which was SCRIBED in my presence. The recorded information has been reviewed and considered accurate. It has been edited as necessary during review. Tilda BurrowFERGUSON,Daelan Gatt V, MD

## 2016-03-17 ENCOUNTER — Encounter: Payer: Self-pay | Admitting: Obstetrics & Gynecology

## 2016-03-17 ENCOUNTER — Ambulatory Visit (INDEPENDENT_AMBULATORY_CARE_PROVIDER_SITE_OTHER): Payer: Medicaid Other | Admitting: Obstetrics & Gynecology

## 2016-03-17 VITALS — BP 110/70 | HR 88 | Wt 188.4 lb

## 2016-03-17 DIAGNOSIS — Z3483 Encounter for supervision of other normal pregnancy, third trimester: Secondary | ICD-10-CM

## 2016-03-17 DIAGNOSIS — Z1159 Encounter for screening for other viral diseases: Secondary | ICD-10-CM

## 2016-03-17 DIAGNOSIS — Z3493 Encounter for supervision of normal pregnancy, unspecified, third trimester: Secondary | ICD-10-CM

## 2016-03-17 DIAGNOSIS — Z331 Pregnant state, incidental: Secondary | ICD-10-CM

## 2016-03-17 DIAGNOSIS — Z1389 Encounter for screening for other disorder: Secondary | ICD-10-CM

## 2016-03-17 DIAGNOSIS — Z3685 Encounter for antenatal screening for Streptococcus B: Secondary | ICD-10-CM

## 2016-03-17 DIAGNOSIS — Z3A38 38 weeks gestation of pregnancy: Secondary | ICD-10-CM

## 2016-03-17 DIAGNOSIS — Z118 Encounter for screening for other infectious and parasitic diseases: Secondary | ICD-10-CM

## 2016-03-17 LAB — POCT URINALYSIS DIPSTICK
Glucose, UA: NEGATIVE
Ketones, UA: NEGATIVE
Leukocytes, UA: NEGATIVE
Nitrite, UA: NEGATIVE
RBC UA: NEGATIVE

## 2016-03-17 LAB — OB RESULTS CONSOLE GBS: GBS: POSITIVE

## 2016-03-17 NOTE — Progress Notes (Signed)
[id number] 8066w3d Estimated Date of Delivery: 04/04/16  Blood pressure 110/70, pulse 88, weight 188 lb 6.4 oz (85.5 kg), last menstrual period 06/29/2015.   BP weight and urine results all reviewed and noted.  Please refer to the obstetrical flow sheet for the fundal height and fetal heart rate documentation:  Patient reports good fetal movement, denies any bleeding and no rupture of membranes symptoms or regular contractions. Patient is without complaints. All questions were answered.  Orders Placed This Encounter  Procedures  . GC/Chlamydia Probe Amp  . Strep Gp B NAA  . POCT urinalysis dipstick    Plan:  Continued routine obstetrical care, GBS done  Return in about 1 week (around 03/24/2016) for LROB.

## 2016-03-19 LAB — STREP GP B NAA: STREP GROUP B AG: POSITIVE — AB

## 2016-03-19 LAB — GC/CHLAMYDIA PROBE AMP
Chlamydia trachomatis, NAA: NEGATIVE
Neisseria gonorrhoeae by PCR: NEGATIVE

## 2016-03-24 ENCOUNTER — Encounter: Payer: Self-pay | Admitting: Women's Health

## 2016-03-24 ENCOUNTER — Ambulatory Visit (INDEPENDENT_AMBULATORY_CARE_PROVIDER_SITE_OTHER): Payer: Medicaid Other | Admitting: Women's Health

## 2016-03-24 VITALS — BP 130/64 | HR 76 | Wt 193.0 lb

## 2016-03-24 DIAGNOSIS — Z3483 Encounter for supervision of other normal pregnancy, third trimester: Secondary | ICD-10-CM

## 2016-03-24 DIAGNOSIS — Z3A39 39 weeks gestation of pregnancy: Secondary | ICD-10-CM | POA: Diagnosis not present

## 2016-03-24 DIAGNOSIS — Z3493 Encounter for supervision of normal pregnancy, unspecified, third trimester: Secondary | ICD-10-CM

## 2016-03-24 DIAGNOSIS — Z1389 Encounter for screening for other disorder: Secondary | ICD-10-CM

## 2016-03-24 DIAGNOSIS — Z331 Pregnant state, incidental: Secondary | ICD-10-CM

## 2016-03-24 LAB — POCT URINALYSIS DIPSTICK
Blood, UA: NEGATIVE
Glucose, UA: NEGATIVE
Ketones, UA: NEGATIVE
NITRITE UA: NEGATIVE

## 2016-03-24 NOTE — Progress Notes (Signed)
Low-risk OB appointment [id number] 9524w3d Estimated Date of Delivery: 04/04/16 BP 130/64   Pulse 76   Wt 193 lb (87.5 kg)   LMP 06/29/2015   BMI 36.47 kg/m   BP, weight, and urine reviewed.  Refer to obstetrical flow sheet for FH & FHR.  Reports good fm.  Denies regular uc's, lof, vb, or uti s/s. No complaints. SVE per request: 1/th/-3, vtx Reviewed labor s/s, fkc. Plan:  Continue routine obstetrical care  F/U in 1wk for OB appointment

## 2016-03-24 NOTE — Patient Instructions (Signed)
Call the office ([phone]) or go to [facility] if:  You begin to have strong, frequent contractions  Your water breaks.  Sometimes it is a big gush of fluid, sometimes it is just a trickle that keeps getting your panties wet or running down your legs  You have vaginal bleeding.  It is normal to have a small amount of spotting if your cervix was checked.   You don't feel your baby moving like normal.  If you don't, get you something to eat and drink and lay down and focus on feeling your baby move.  You should feel at least 10 movements in 2 hours.  If you don't, you should call the office or go to [facility].    Braxton Hicks Contractions Contractions of the uterus can occur throughout pregnancy. Contractions are not always a sign that you are in labor.  WHAT ARE BRAXTON HICKS CONTRACTIONS?  Contractions that occur before labor are called Braxton Hicks contractions, or false labor. Toward the end of pregnancy (32-34 weeks), these contractions can develop more often and may become more forceful. This is not true labor because these contractions do not result in opening (dilatation) and thinning of the cervix. They are sometimes difficult to tell apart from true labor because these contractions can be forceful and people have different pain tolerances. You should not feel embarrassed if you go to the hospital with false labor. Sometimes, the only way to tell if you are in true labor is for your health care provider to look for changes in the cervix. If there are no prenatal problems or other health problems associated with the pregnancy, it is completely safe to be sent home with false labor and await the onset of true labor. HOW CAN YOU TELL THE DIFFERENCE BETWEEN TRUE AND FALSE LABOR? False Labor  The contractions of false labor are usually shorter and not as hard as those of true labor.   The contractions are usually irregular.   The contractions are often felt in the front of  the lower abdomen and in the groin.   The contractions may go away when you walk around or change positions while lying down.   The contractions get weaker and are shorter lasting as time goes on.   The contractions do not usually become progressively stronger, regular, and closer together as with true labor.  True Labor  Contractions in true labor last 30-70 seconds, become very regular, usually become more intense, and increase in frequency.   The contractions do not go away with walking.   The discomfort is usually felt in the top of the uterus and spreads to the lower abdomen and low back.   True labor can be determined by your health care provider with an exam. This will show that the cervix is dilating and getting thinner.  WHAT TO REMEMBER  Keep up with your usual exercises and follow other instructions given by your health care provider.   Take medicines as directed by your health care provider.   Keep your regular prenatal appointments.   Eat and drink lightly if you think you are going into labor.   If Braxton Hicks contractions are making you uncomfortable:   Change your position from lying down or resting to walking, or from walking to resting.   Sit and rest in a tub of warm water.   Drink 2-3 glasses of water. Dehydration may cause these contractions.   Do slow and deep breathing several times an hour.    WHEN SHOULD I SEEK IMMEDIATE MEDICAL CARE? Seek immediate medical care if:  Your contractions become stronger, more regular, and closer together.   You have fluid leaking or gushing from your vagina.   You have a fever.   You pass blood-tinged mucus.   You have vaginal bleeding.   You have continuous abdominal pain.   You have low back pain that you never had before.   You feel your baby's head pushing down and causing pelvic pressure.   Your baby is not moving as much as it used to.    This information is not intended to  replace advice given to you by your health care provider. Make sure you discuss any questions you have with your health care provider.   Document Released: 07/21/2005 Document Revised: 07/26/2013 Document Reviewed: 05/02/2013 Elsevier Interactive Patient Education 2016 [employer] Inc[employer]  

## 2016-03-27 ENCOUNTER — Inpatient Hospital Stay (HOSPITAL_COMMUNITY)
Admission: AD | Admit: 2016-03-27 | Discharge: 2016-03-29 | DRG: 775 | Disposition: A | Discharge status: Home / Self Care | Payer: Medicaid Other | Source: Ambulatory Visit | Attending: Obstetrics and Gynecology | Admitting: Obstetrics and Gynecology

## 2016-03-27 ENCOUNTER — Encounter (HOSPITAL_COMMUNITY): Payer: Self-pay

## 2016-03-27 DIAGNOSIS — O99824 Streptococcus B carrier state complicating childbirth: Secondary | ICD-10-CM | POA: Diagnosis present

## 2016-03-27 DIAGNOSIS — Z3A38 38 weeks gestation of pregnancy: Secondary | ICD-10-CM

## 2016-03-27 DIAGNOSIS — Z87891 Personal history of nicotine dependence: Secondary | ICD-10-CM | POA: Diagnosis not present

## 2016-03-27 DIAGNOSIS — Z3483 Encounter for supervision of other normal pregnancy, third trimester: Secondary | ICD-10-CM | POA: Diagnosis present

## 2016-03-27 DIAGNOSIS — O9902 Anemia complicating childbirth: Secondary | ICD-10-CM | POA: Diagnosis present

## 2016-03-27 DIAGNOSIS — O4202 Full-term premature rupture of membranes, onset of labor within 24 hours of rupture: Secondary | ICD-10-CM | POA: Diagnosis present

## 2016-03-27 DIAGNOSIS — Z3493 Encounter for supervision of normal pregnancy, unspecified, third trimester: Secondary | ICD-10-CM

## 2016-03-27 DIAGNOSIS — D573 Sickle-cell trait: Secondary | ICD-10-CM | POA: Diagnosis present

## 2016-03-27 DIAGNOSIS — IMO0001 Reserved for inherently not codable concepts without codable children: Secondary | ICD-10-CM

## 2016-03-27 LAB — TYPE AND SCREEN
ABO/RH(D): O POS
ANTIBODY SCREEN: NEGATIVE

## 2016-03-27 LAB — CBC
HCT: 29.6 % — ABNORMAL LOW (ref 36.0–46.0)
Hemoglobin: 9.8 g/dL — ABNORMAL LOW (ref 12.0–15.0)
MCH: 24.7 pg — ABNORMAL LOW (ref 26.0–34.0)
MCHC: 33.1 g/dL (ref 30.0–36.0)
MCV: 74.7 fL — ABNORMAL LOW (ref 78.0–100.0)
PLATELETS: 250 10*3/uL (ref 150–400)
RBC: 3.96 MIL/uL (ref 3.87–5.11)
RDW: 15.2 % (ref 11.5–15.5)
WBC: 14.3 10*3/uL — AB (ref 4.0–10.5)

## 2016-03-27 LAB — POCT FERN TEST: POCT FERN TEST: POSITIVE

## 2016-03-27 LAB — ABO/RH: ABO/RH(D): O POS

## 2016-03-27 MED ORDER — LACTATED RINGERS IV SOLN
500.0000 mL | Freq: Once | INTRAVENOUS | Status: AC
  Administered 2016-03-28: 500 mL via INTRAVENOUS

## 2016-03-27 MED ORDER — DEXTROSE 5 % IV SOLN
5.0000 10*6.[IU] | Freq: Once | INTRAVENOUS | Status: AC
  Administered 2016-03-27: 5 10*6.[IU] via INTRAVENOUS
  Filled 2016-03-27: qty 5

## 2016-03-27 MED ORDER — SOD CITRATE-CITRIC ACID 500-334 MG/5ML PO SOLN: 30.0000 mL | ORAL | Status: DC | PRN

## 2016-03-27 MED ORDER — TERBUTALINE SULFATE 1 MG/ML IJ SOLN
0.2500 mg | Freq: Once | INTRAMUSCULAR | Status: DC | PRN
  Filled 2016-03-27: qty 1

## 2016-03-27 MED ORDER — OXYTOCIN 40 UNITS IN LACTATED RINGERS INFUSION - SIMPLE MED
1.0000 m[IU]/min | INTRAVENOUS | Status: DC
  Administered 2016-03-27: 2 m[IU]/min via INTRAVENOUS
  Filled 2016-03-27: qty 1000

## 2016-03-27 MED ORDER — PHENYLEPHRINE 40 MCG/ML (10ML) SYRINGE FOR IV PUSH (FOR BLOOD PRESSURE SUPPORT)
80.0000 ug | PREFILLED_SYRINGE | INTRAVENOUS | Status: DC | PRN
  Filled 2016-03-27: qty 5
  Filled 2016-03-27: qty 10

## 2016-03-27 MED ORDER — FENTANYL CITRATE (PF) 100 MCG/2ML IJ SOLN
50.0000 ug | INTRAMUSCULAR | Status: DC | PRN
  Administered 2016-03-27: 50 ug via INTRAVENOUS
  Filled 2016-03-27: qty 2

## 2016-03-27 MED ORDER — LIDOCAINE HCL (PF) 1 % IJ SOLN
30.0000 mL | INTRAMUSCULAR | Status: DC | PRN
  Filled 2016-03-27: qty 30

## 2016-03-27 MED ORDER — DIPHENHYDRAMINE HCL 50 MG/ML IJ SOLN: 12.5000 mg | INTRAMUSCULAR | Status: DC | PRN

## 2016-03-27 MED ORDER — ACETAMINOPHEN 325 MG PO TABS: 650.0000 mg | ORAL_TABLET | ORAL | Status: DC | PRN

## 2016-03-27 MED ORDER — LACTATED RINGERS IV SOLN: 500.0000 mL | INTRAVENOUS | Status: DC | PRN

## 2016-03-27 MED ORDER — MISOPROSTOL 25 MCG QUARTER TABLET
25.0000 ug | ORAL_TABLET | ORAL | Status: DC | PRN
  Administered 2016-03-27: 25 ug via VAGINAL
  Filled 2016-03-27: qty 1
  Filled 2016-03-27: qty 0.25

## 2016-03-27 MED ORDER — FLEET ENEMA 7-19 GM/118ML RE ENEM: 1.0000 | ENEMA | RECTAL | Status: DC | PRN

## 2016-03-27 MED ORDER — LACTATED RINGERS IV SOLN
INTRAVENOUS | Status: DC
  Administered 2016-03-27 – 2016-03-28 (×3): via INTRAVENOUS

## 2016-03-27 MED ORDER — FENTANYL 2.5 MCG/ML BUPIVACAINE 1/10 % EPIDURAL INFUSION (WH - ANES)
14.0000 mL/h | INTRAMUSCULAR | Status: DC | PRN
Start: 2016-03-27 — End: 2016-03-28
  Administered 2016-03-28: 14 mL/h via EPIDURAL
  Filled 2016-03-27: qty 125

## 2016-03-27 MED ORDER — OXYTOCIN BOLUS FROM INFUSION
500.0000 mL | Freq: Once | INTRAVENOUS | Status: AC
  Administered 2016-03-28: 500 mL via INTRAVENOUS

## 2016-03-27 MED ORDER — PENICILLIN G POTASSIUM 5000000 UNITS IJ SOLR
2.5000 10*6.[IU] | INTRAVENOUS | Status: DC
  Administered 2016-03-27 – 2016-03-28 (×3): 2.5 10*6.[IU] via INTRAVENOUS
  Filled 2016-03-27 (×6): qty 2.5

## 2016-03-27 MED ORDER — ONDANSETRON HCL 4 MG/2ML IJ SOLN: 4.0000 mg | Freq: Four times a day (QID) | INTRAMUSCULAR | Status: DC | PRN

## 2016-03-27 MED ORDER — EPHEDRINE 5 MG/ML INJ
10.0000 mg | INTRAVENOUS | Status: DC | PRN
  Filled 2016-03-27: qty 4

## 2016-03-27 MED ORDER — OXYTOCIN 40 UNITS IN LACTATED RINGERS INFUSION - SIMPLE MED
2.5000 [IU]/h | INTRAVENOUS | Status: DC
Start: 2016-03-27 — End: 2016-03-28

## 2016-03-27 MED ORDER — OXYCODONE-ACETAMINOPHEN 5-325 MG PO TABS: 2.0000 | ORAL_TABLET | ORAL | Status: DC | PRN

## 2016-03-27 MED ORDER — OXYCODONE-ACETAMINOPHEN 5-325 MG PO TABS: 1.0000 | ORAL_TABLET | ORAL | Status: DC | PRN

## 2016-03-27 MED ORDER — PHENYLEPHRINE 40 MCG/ML (10ML) SYRINGE FOR IV PUSH (FOR BLOOD PRESSURE SUPPORT)
80.0000 ug | PREFILLED_SYRINGE | INTRAVENOUS | Status: DC | PRN
  Filled 2016-03-27: qty 10
  Filled 2016-03-27: qty 5

## 2016-03-27 NOTE — MAU Note (Signed)
Possible rupture of membranes around 0730

## 2016-03-27 NOTE — Progress Notes (Signed)
   Julia Castro is a 25 y.o. [id number] at 8196w6d  admitted for induction of labor due to PROM.  Subjective: Feeling ctx as mild  Objective: Vitals:   03/27/16 1621 03/27/16 1846 03/27/16 1850 03/27/16 1944  BP: 120/64  115/60 115/74  Pulse: 72  75 81  Resp:  18  18  Temp: 98.1 F (36.7 C) 98.1 F (36.7 C)    TempSrc: Oral Oral    Weight:      Height:       No intake/output data recorded.  FHT:  FHR: 140 bpm, variability: moderate,  accelerations:  Present,  decelerations:  Absent and   UC:   irregular, every 1-3 minutes SVE:   Dilation: 1.5 Effacement (%): 50 Station: -2 Exam by:: Julia SlipperJane Bailey, RN  Foley placed in cx and inflated w/60cc H20.  Contracting too often for more cytotec right now  Labs: Lab Results  Component Value Date   WBC 14.3 (H) 03/27/2016   HGB 9.8 (L) 03/27/2016   HCT 29.6 (L) 03/27/2016   MCV 74.7 (L) 03/27/2016   PLT 250 03/27/2016    Assessment / Plan: IOL for PROM, ripening phase Will start pitocin when foley falls out if not in labor Labor: Progressing normally Fetal Wellbeing:  Category I Pain Control:  Labor support without medications Anticipated MOD:  NSVD  Julia Castro,Julia Castro 03/27/2016, 7:50 PM

## 2016-03-27 NOTE — Anesthesia Pain Management Evaluation Note (Signed)
  CRNA Pain Management Visit Note  Patient: Julia GentileJazmin L Sanderlin, 25 y.o., female  "Hello I am a [facility] of the anesthesia [facility]. We have an anesthesia team available at all times to provide care throughout the hospital, including epidural management and anesthesia for C-section. I don't know your plan for the delivery whether it a natural birth, water birth, IV sedation, nitrous supplementation, doula or epidural, but we want to meet your pain goals."   1.Was your pain managed to your expectations on prior hospitalizations?   Yes   2.What is your expectation for pain management during this hospitalization?     Labor support without medications  3.How can we help you reach that goal? unsure  Record the patient's initial score and the patient's pain goal.   Pain: 1  Pain Goal: 8 [facility] wants you to be able to say your pain was always managed very well.  Cephus ShellingBURGER,Jensen Cheramie 03/27/2016

## 2016-03-27 NOTE — Progress Notes (Signed)
Labor Progress Note Julia Castro is a 25 y.o. [id number] at 825w6d presented for induction of labor due to PROM S: Patient contracting. Every 3-4 minutes   O:  BP 115/74   Pulse 81   Temp 98.1 F (36.7 C) (Oral)   Resp 18   Ht 5\' 1"  (1.549 m)   Wt 87.5 kg (193 lb)   LMP 06/29/2015   BMI 36.47 kg/m  EFM: 140/moderate/accels+   CVE: Dilation: 1.5 Effacement (%): 50 Cervical Position: Middle Station: -2 Presentation: Vertex Exam by:: Julia SlipperJane Bailey, RN   A&P: 25 y.o. [id number] 765w6d here for IOL due to PROM  #Labor: Pitocin 2 by 2  #Pain: Fentanyl vs. Nitrous oxide vs. Epidural --patient to decide  #FWB:Category 1  #GBS positive - Penicillin   Casmir Auguste Angelene GiovanniZ Trista Ciocca, MD 8:51 PM

## 2016-03-27 NOTE — H&P (Signed)
LABOR AND DELIVERY ADMISSION HISTORY AND PHYSICAL NOTE  Julia Castro is a 25 y.o. female [phone] with IUP at [redacted]w[redacted]d by 8.5wk U/S and LMP presenting for latent labor. Has leaking of clear fluid with a big gush starting around 7:30am today. Does not feel any contractions. She reports positive fetal movement. She denies vaginal bleeding.  Prenatal History/Complications: none  Past Medical History: Past Medical History:  Diagnosis Date  . Chlamydia   . Heart murmur    Benign  . Panic attacks     Past Surgical History: Past Surgical History:  Procedure Laterality Date  . INDUCED ABORTION  09/2013  . WISDOM TOOTH EXTRACTION      Obstetrical History: OB History    Gravida Para Term Preterm AB Living   4 2 2  0 1 2   SAB TAB Ectopic Multiple Live Births   0 0 0 0 2      Social History: Social History   Social History  . Marital status: Single    Spouse name: N/A  . Number of children: N/A  . Years of education: N/A   Social History Main Topics  . Smoking status: Former Smoker    Years: 3.00    Types: Cigars  . Smokeless tobacco: Never Used     Comment: smokes 1 daily  . Alcohol use No  . Drug use: No  . Sexual activity: Yes    Birth control/ protection: None   Other Topics Concern  . None   Social History Narrative  . None    Family History: Family History  Problem Relation Age of Onset  . Other Mother     heart issues  . Seizures Father   . Cancer Sister   . Mental retardation Brother   . Mental illness Maternal Grandmother   . Anxiety disorder Maternal Grandmother   . Alcohol abuse Maternal Grandfather     in the past  . Anxiety disorder Paternal Grandmother   . Other Brother     MVA  . SIDS Sister   . Anesthesia problems Neg Hx     Allergies: No Known Allergies  No prescriptions prior to admission.     Review of Systems   All systems reviewed and negative except as stated in HPI  Blood pressure 119/72, pulse 103, temperature 99.2  F (37.3 C), temperature source Oral, height 5\' 1"  (1.549 m), weight 87.5 kg (193 lb), last menstrual period 06/29/2015. General appearance: alert, cooperative and no distress Lungs: no respiratory distress Heart: regular rate Abdomen: soft, non-tender Extremities: No calf swelling or tenderness Presentation: cephalic by ultrasound Fetal monitoring: 140, moderate variability, +accelerations, no decelerations Uterine activity: irreg mild Dilation: 1 Effacement (%): 50 Station: -3 Exam by:: Ginger Morris RN   Prenatal labs: ABO, Rh: O/Positive/-- (02/22 1647) Antibody: Negative (06/07 0853) Rubella: immune RPR: Non Reactive (06/07 0853)  HBsAg: Negative (02/22 1647)  HIV: Non Reactive (06/07 0853)  GBS: Positive (08/14 1600)  Genetic screening:  negative Anatomy US: normal  Prenatal Transfer Tool  Maternal Diabetes: No Genetic Screening: Normal Maternal Ultrasounds/Referrals: Normal Fetal Ultrasounds or other Referrals:  None Maternal Substance Abuse:  No Significant Maternal Medications:  None Significant Maternal Lab Results: Lab values include: Group B Strep positive  No results found for this or any previous visit (from the past 24 hour(s)).  Patient Active Problem List   Diagnosis Date Noted  . Pica 01/09/2016  . Sickle cell trait (HCC) 10/01/2015  . Supervision of normal pregnancy 08/29/2015  .  Smoker 08/29/2015  . Anxiety w/ panic attacks 08/29/2015    Assessment: Julia Castro is a 25 y.o. [id number] at 3447w6d here for latent labor  #Labor: pending cervical recheck #Pain: fentanyl #FWB: Category I #ID:  GBS pos #MOF: bottle #MOC:nexplanon #Circ:  outpatient  Leland HerElsia J Yoo, DO PGY-1 8/24/201710:13 AM  OB FELLOW HISTORY AND PHYSICAL ATTESTATION  I have seen and examined this patient; I agree with above documentation in the resident's note.     Pts cervix checked. 1.5 thick and high. Vertex confirmed by US. Will give cytotec x1.   Ernestina Pennaicholas  Luccas Towell 03/27/2016, 2:53 PM

## 2016-03-28 ENCOUNTER — Encounter (HOSPITAL_COMMUNITY): Payer: Self-pay

## 2016-03-28 ENCOUNTER — Inpatient Hospital Stay (HOSPITAL_COMMUNITY): Payer: Medicaid Other | Admitting: Anesthesiology

## 2016-03-28 DIAGNOSIS — O4202 Full-term premature rupture of membranes, onset of labor within 24 hours of rupture: Secondary | ICD-10-CM

## 2016-03-28 DIAGNOSIS — O99824 Streptococcus B carrier state complicating childbirth: Secondary | ICD-10-CM

## 2016-03-28 DIAGNOSIS — O9902 Anemia complicating childbirth: Secondary | ICD-10-CM

## 2016-03-28 DIAGNOSIS — Z3A38 38 weeks gestation of pregnancy: Secondary | ICD-10-CM

## 2016-03-28 DIAGNOSIS — D573 Sickle-cell trait: Secondary | ICD-10-CM

## 2016-03-28 DIAGNOSIS — Z87891 Personal history of nicotine dependence: Secondary | ICD-10-CM

## 2016-03-28 LAB — RPR: RPR: NONREACTIVE

## 2016-03-28 MED ORDER — OXYTOCIN 10 UNIT/ML IJ SOLN
INTRAMUSCULAR | Status: AC
  Filled 2016-03-28: qty 1

## 2016-03-28 MED ORDER — ZOLPIDEM TARTRATE 5 MG PO TABS: 5.0000 mg | ORAL_TABLET | Freq: Every evening | ORAL | Status: DC | PRN

## 2016-03-28 MED ORDER — SENNOSIDES-DOCUSATE SODIUM 8.6-50 MG PO TABS
2.0000 | ORAL_TABLET | ORAL | Status: DC
Start: 2016-03-29 — End: 2016-03-29
  Administered 2016-03-29: 2 via ORAL
  Filled 2016-03-28: qty 2

## 2016-03-28 MED ORDER — LIDOCAINE HCL (PF) 1 % IJ SOLN
INTRAMUSCULAR | Status: AC
  Filled 2016-03-28: qty 30

## 2016-03-28 MED ORDER — SIMETHICONE 80 MG PO CHEW: 80.0000 mg | CHEWABLE_TABLET | ORAL | Status: DC | PRN

## 2016-03-28 MED ORDER — COCONUT OIL OIL: 1.0000 "application " | TOPICAL_OIL | Status: DC | PRN

## 2016-03-28 MED ORDER — COMPLETENATE 29-1 MG PO CHEW
1.0000 | CHEWABLE_TABLET | Freq: Every day | ORAL | Status: DC
  Administered 2016-03-28 – 2016-03-29 (×2): 1 via ORAL
  Filled 2016-03-28 (×3): qty 1

## 2016-03-28 MED ORDER — ONDANSETRON HCL 4 MG PO TABS: 4.0000 mg | ORAL_TABLET | ORAL | Status: DC | PRN

## 2016-03-28 MED ORDER — BENZOCAINE-MENTHOL 20-0.5 % EX AERO
1.0000 "application " | INHALATION_SPRAY | CUTANEOUS | Status: DC | PRN
  Administered 2016-03-28: 1 via TOPICAL
  Filled 2016-03-28: qty 56

## 2016-03-28 MED ORDER — PRENATAL MULTIVITAMIN CH: 1.0000 | ORAL_TABLET | Freq: Every day | ORAL | Status: DC

## 2016-03-28 MED ORDER — ONDANSETRON HCL 4 MG/2ML IJ SOLN: 4.0000 mg | INTRAMUSCULAR | Status: DC | PRN

## 2016-03-28 MED ORDER — IBUPROFEN 600 MG PO TABS: 600.0000 mg | ORAL_TABLET | Freq: Four times a day (QID) | ORAL | Status: DC

## 2016-03-28 MED ORDER — DIPHENHYDRAMINE HCL 25 MG PO CAPS: 25.0000 mg | ORAL_CAPSULE | Freq: Four times a day (QID) | ORAL | Status: DC | PRN

## 2016-03-28 MED ORDER — EPHEDRINE 5 MG/ML INJ
10.0000 mg | INTRAVENOUS | Status: DC | PRN
  Filled 2016-03-28: qty 4

## 2016-03-28 MED ORDER — LACTATED RINGERS IV SOLN: 500.0000 mL | Freq: Once | INTRAVENOUS | Status: DC

## 2016-03-28 MED ORDER — PHENYLEPHRINE 40 MCG/ML (10ML) SYRINGE FOR IV PUSH (FOR BLOOD PRESSURE SUPPORT)
80.0000 ug | PREFILLED_SYRINGE | INTRAVENOUS | Status: DC | PRN
  Filled 2016-03-28: qty 5

## 2016-03-28 MED ORDER — OXYTOCIN 40 UNITS IN LACTATED RINGERS INFUSION - SIMPLE MED
INTRAVENOUS | Status: AC
  Filled 2016-03-28: qty 1000

## 2016-03-28 MED ORDER — ACETAMINOPHEN 325 MG PO TABS
650.0000 mg | ORAL_TABLET | ORAL | Status: DC | PRN
  Administered 2016-03-29: 650 mg via ORAL
  Filled 2016-03-28: qty 2

## 2016-03-28 MED ORDER — LIDOCAINE HCL (PF) 1 % IJ SOLN
INTRAMUSCULAR | Status: DC | PRN
  Administered 2016-03-28: 4 mL via EPIDURAL

## 2016-03-28 MED ORDER — DIBUCAINE 1 % RE OINT: 1.0000 "application " | TOPICAL_OINTMENT | RECTAL | Status: DC | PRN

## 2016-03-28 MED ORDER — IBUPROFEN 100 MG/5ML PO SUSP
600.0000 mg | Freq: Four times a day (QID) | ORAL | Status: DC
  Administered 2016-03-28 – 2016-03-29 (×6): 600 mg via ORAL
  Filled 2016-03-28 (×10): qty 30

## 2016-03-28 MED ORDER — TETANUS-DIPHTH-ACELL PERTUSSIS 5-2.5-18.5 LF-MCG/0.5 IM SUSP: 0.5000 mL | Freq: Once | INTRAMUSCULAR | Status: DC

## 2016-03-28 MED ORDER — WITCH HAZEL-GLYCERIN EX PADS
1.0000 "application " | MEDICATED_PAD | CUTANEOUS | Status: DC | PRN
  Administered 2016-03-28: 1 via TOPICAL

## 2016-03-28 NOTE — Progress Notes (Signed)
[facility] by spouse and RN. Transfers from Wheelchair to bed with steady gait. Vitals WNL, Lungs CTAB, Fundus is firm and one below the umbilicus with small rubra lochia. IV saline lock intact in RUE. Color is pale- pink. Oriented to room, call light, visiting hours and paper work. Pain scale is 0-1 at this time. Condition stable.

## 2016-03-28 NOTE — Anesthesia Postprocedure Evaluation (Signed)
Anesthesia Post Note  Patient: Julia Castro  Procedure(s) Performed: * No procedures listed *  Patient location during evaluation: Mother Baby Anesthesia Type: Epidural Level of consciousness: awake and alert, oriented and patient cooperative Pain management: pain level controlled Vital Signs Assessment: post-procedure vital signs reviewed and stable Respiratory status: spontaneous breathing Cardiovascular status: stable Postop Assessment: no headache, epidural receding, patient able to bend at knees and no signs of nausea or vomiting Anesthetic complications: no Comments: Pain score 1 without cramps 6 when she has a cramp.  Manageable.     Last Vitals:  Vitals:   03/28/16 0431 03/28/16 1324  BP: 119/63 (!) 118/57  Pulse: 75 (!) 58  Resp: 18 20  Temp:  36.8 C    Last Pain:  Vitals:   03/28/16 0747  TempSrc:   PainSc: 3    Pain Goal: Patients Stated Pain Goal: 1 (03/28/16 0650)               Merrilyn PumaWRINKLE,Crissie Aloi

## 2016-03-28 NOTE — Plan of Care (Signed)
Problem: Education: Goal: Knowledge of condition will improve Outcome: Completed/Met Date Met: 03/28/16 Patient has declined Tdap vaccine.

## 2016-03-28 NOTE — Anesthesia Procedure Notes (Signed)
Epidural Patient location during procedure: OB Start time: 03/28/2016 12:36 AM End time: 03/28/2016 12:43 AM  Staffing Anesthesiologist: Shona SimpsonHOLLIS, Clancy Leiner D Performed: anesthesiologist   Preanesthetic Checklist Completed: patient identified, site marked, surgical consent, pre-op evaluation, timeout performed, IV checked, risks and benefits discussed and monitors and equipment checked  Epidural Patient position: sitting Prep: ChloraPrep Patient monitoring: heart rate, continuous pulse ox and blood pressure Approach: midline Location: L3-L4 Injection technique: LOR saline  Needle:  Needle type: Tuohy  Needle gauge: 17 G Needle length: 9 cm Catheter type: closed end flexible Catheter size: 20 Guage Test dose: negative and 1.5% lidocaine  Assessment Events: blood not aspirated, injection not painful, no injection resistance and no paresthesia  Additional Notes LOR @ 6.5  Patient identified. Risks/Benefits/Options discussed with patient including but not limited to bleeding, infection, nerve damage, paralysis, failed block, incomplete pain control, headache, blood pressure changes, nausea, vomiting, reactions to medications, itching and postpartum back pain. Confirmed with bedside nurse the patient's most recent platelet count. Confirmed with patient that they are not currently taking any anticoagulation, have any bleeding history or any family history of bleeding disorders. Patient expressed understanding and wished to proceed. All questions were answered. Sterile technique was used throughout the entire procedure. Please see nursing notes for vital signs. Test dose was given through epidural catheter and negative prior to continuing to dose epidural or start infusion. Warning signs of high block given to the patient including shortness of breath, tingling/numbness in hands, complete motor block, or any concerning symptoms with instructions to call for help. Patient was given instructions on  fall risk and not to get out of bed. All questions and concerns addressed with instructions to call with any issues or inadequate analgesia.    Reason for block:procedure for pain

## 2016-03-28 NOTE — Anesthesia Preprocedure Evaluation (Signed)
Anesthesia Evaluation  Patient identified by MRN, date of birth, ID band Patient awake    Reviewed: Allergy & Precautions, Patient's Chart, lab work & pertinent test results  Airway Mallampati: III       Dental  (+) Teeth Intact   Pulmonary former smoker,    breath sounds clear to auscultation       Cardiovascular negative cardio ROS   Rhythm:Regular Rate:Normal     Neuro/Psych PSYCHIATRIC DISORDERS Anxiety    GI/Hepatic negative GI ROS, Neg liver ROS,   Endo/Other  negative endocrine ROS  Renal/GU negative Renal ROS  negative genitourinary   Musculoskeletal negative musculoskeletal ROS (+)   Abdominal   Peds negative pediatric ROS (+)  Hematology negative hematology ROS (+)   Anesthesia Other Findings   Reproductive/Obstetrics (+) Pregnancy                             Lab Results  Component Value Date   WBC 14.3 (H) 03/27/2016   HGB 9.8 (L) 03/27/2016   HCT 29.6 (L) 03/27/2016   MCV 74.7 (L) 03/27/2016   PLT 250 03/27/2016   No results found for: INR, PROTIME    Anesthesia Physical Anesthesia Plan  ASA: II  Anesthesia Plan: Epidural   Post-op Pain Management:    Induction:   Airway Management Planned:   Additional Equipment:   Intra-op Plan:   Post-operative Plan:   Informed Consent: I have reviewed the patients History and Physical, chart, labs and discussed the procedure including the risks, benefits and alternatives for the proposed anesthesia with the patient or authorized representative who has indicated his/her understanding and acceptance.     Plan Discussed with:   Anesthesia Plan Comments:         Anesthesia Quick Evaluation

## 2016-03-28 NOTE — Plan of Care (Signed)
Problem: Bowel/Gastric: Goal: Gastrointestinal status will improve Outcome: Progressing TUCS pads helping hemorrhoidal discomfort.

## 2016-03-29 MED ORDER — PRENATAL PLUS 27-1 MG PO TABS: 1.0000 | ORAL_TABLET | Freq: Every day | ORAL | 0 refills | Status: DC

## 2016-03-29 MED ORDER — IBUPROFEN 600 MG PO TABS: 600.0000 mg | ORAL_TABLET | Freq: Four times a day (QID) | ORAL | 1 refills | Status: DC | PRN

## 2016-03-29 NOTE — Progress Notes (Signed)
CSW met with pt to discuss c/s for pt's hx of anxiety/panic attacks.  At this time, pt denies any mental health issues/concerns, is not on any psychotropic medications and does not receive therapy.  PPD discussed and pt is agreeable to f/u with her MD for any changes in her mood/behavior, post-discharge.  No other social work needs noted, Evans signing off.  Please re-consult if any other needs should arise.  Creta Levin, LCSW Weekend Coverage [phone]

## 2016-03-29 NOTE — Discharge Summary (Signed)
OB Discharge Summary     Patient Name: Julia Castro DOB: August 17, 1990 MRN: [id number]  Date of admission: 03/27/2016 Delivering MD: Jacklyn Shell   Date of discharge: 03/29/2016  Admitting diagnosis: 38WKS,WATER BROKE Intrauterine pregnancy: [redacted]w[redacted]d     Secondary diagnosis:  Active Problems:   Active labor  Additional problems: none     Discharge diagnosis: Term Pregnancy Delivered                                                                                                Post partum procedures:none  Augmentation: Pitocin and Foley Balloon  Complications: None  Hospital course:  Induction of Labor With Vaginal Delivery   25 y.o. yo [phone] at [redacted]w[redacted]d was admitted to the hospital 03/27/2016 for induction of labor.  Indication for induction: PROM.  Patient had an uncomplicated labor course as follows: Membrane Rupture Time/Date: 7:30 AM ,03/27/2016   Intrapartum Procedures: Episiotomy: None [1]                                         Lacerations:  None [1]  Patient had delivery of a Viable infant.  Information for the patient's newborn:  Julia Castro [[id number]]  Delivery Method: Vag-Spont   03/28/2016  Details of delivery can be found in separate delivery note.  Patient had a routine postpartum course. Patient is discharged home 03/29/16.   Physical exam Vitals:   03/28/16 1700 03/28/16 1736 03/28/16 2307 03/29/16 0600  BP: 114/64 125/89 (!) 123/59 134/80  Pulse: 68 (!) 54 69 (!) 58  Resp: 20 18 18 18   Temp: 98.4 F (36.9 C) 98.4 F (36.9 C) 98 F (36.7 C) 98.5 F (36.9 C)  TempSrc: Oral Oral Oral Oral  SpO2:   99%   Weight:      Height:       General: alert, cooperative and no distress Lochia: appropriate Uterine Fundus: firm Incision: N/A DVT Evaluation: No evidence of DVT seen on physical exam. Labs: Lab Results  Component Value Date   WBC 14.3 (H) 03/27/2016   HGB 9.8 (L) 03/27/2016   HCT 29.6 (L) 03/27/2016   MCV 74.7 (L) 03/27/2016    PLT 250 03/27/2016   No flowsheet data found.  Discharge instruction: per After Visit Summary and "Baby and Me Booklet".  After visit meds:    Medication List    TAKE these medications   ibuprofen 600 MG tablet Commonly known as:  ADVIL,MOTRIN Take 1 tablet (600 mg total) by mouth every 6 (six) hours as needed.   prenatal vitamin w/FE, FA 27-1 MG Tabs tablet Take 1 tablet by mouth daily.       Diet: routine diet  Activity: Advance as tolerated. Pelvic rest for 6 weeks.   Outpatient follow up:6 weeks Follow up Appt:Future Appointments Date Time Provider Department Center  03/31/2016 1:30 PM Cheral Marker, CNM [facility]   Follow up Visit:No Follow-up on file.  Postpartum contraception: Nexplanon  Newborn Data: Live born female  Birth  Weight: 7 lb 10.2 oz (3464 g) APGAR: 8, 9  Baby Feeding: Breast Disposition:home with mother   03/29/2016 [name], CNM

## 2016-03-29 NOTE — Discharge Instructions (Signed)

## 2016-03-31 ENCOUNTER — Encounter: Payer: Medicaid Other | Admitting: Women's Health

## 2016-04-29 ENCOUNTER — Encounter: Payer: Self-pay | Admitting: Women's Health

## 2016-04-29 ENCOUNTER — Ambulatory Visit (INDEPENDENT_AMBULATORY_CARE_PROVIDER_SITE_OTHER): Payer: Medicaid Other | Admitting: Women's Health

## 2016-04-29 NOTE — Progress Notes (Signed)
Subjective:    Julia Castro is a 25 y.o. [phone] African American female who presents for a postpartum visit. She is 4 weeks postpartum following a spontaneous vaginal delivery at 39.0 gestational weeks. Anesthesia: epidural. I have fully reviewed the prenatal and intrapartum course. Postpartum course has been uncomplicated. Baby's course has been uncomplicated. Baby is feeding by bottle. Bleeding no bleeding. Bowel function is some constipation. Bladder function is normal. Patient is sexually active. Last sexual activity: 19/21. Contraception method is none and wants nexplanon. Postpartum depression screening: positive. Score 12. Does not feel depressed, just overwhelmed at times. Denies SI/HI/II.  Last pap 04/10/15 and was neg. Had PICA- cornstarch during pregnancy, states craving was gone as soon as he had baby.   The following portions of the patient's history were reviewed and updated as appropriate: allergies, current medications, past medical history, past surgical history and problem list.  Review of Systems Pertinent items are noted in HPI.   Vitals:   04/29/16 1125  BP: 132/84  Pulse: 64  Weight: 173 lb (78.5 kg)   No LMP recorded.  Objective:   General:  alert, cooperative and no distress   Breasts:  deferred, no complaints  Lungs: clear to auscultation bilaterally  Heart:  regular rate and rhythm  Abdomen: soft, nontender   Vulva: normal  Vagina: normal vagina  Cervix:  closed  Corpus: Well-involuted  Adnexa:  Non-palpable  Rectal Exam: No hemorrhoids        Assessment:   Postpartum exam 4 wks s/p SVB Bottlefeeding Depression screening Contraception counseling   Plan:  Contraception: abstinence until nexplanon placed Follow up in: 10/5 (14d from last sex) for UPT and Nexplanon insertion, call earlier if period starts  Marge DuncansBooker, Darenda Fike Randall CNM, [facility] 04/29/2016 11:50 AM

## 2016-04-29 NOTE — Patient Instructions (Signed)
NO SEX UNTIL AFTER YOU GET YOUR BIRTH CONTROL   Constipation  Drink plenty of fluid, preferably water, throughout the day  Eat foods high in fiber such as fruits, vegetables, and grains  Exercise, such as walking, is a good way to keep your bowels regular  Drink warm fluids, especially warm prune juice, or decaf coffee  Eat a 1/2 cup of real oatmeal (not instant), 1/2 cup applesauce, and 1/2-1 cup warm prune juice every day  If needed, you may take Colace (docusate sodium) stool softener once or twice a day to help keep the stool soft. If you are pregnant, wait until you are out of your first trimester (12-14 weeks of pregnancy)  If you still are having problems with constipation, you may take Miralax once daily as needed to help keep your bowels regular.  If you are pregnant, wait until you are out of your first trimester (12-14 weeks of pregnancy)   Etonogestrel implant What is this medicine? ETONOGESTREL (et oh noe JES trel) is a contraceptive (birth control) device. It is used to prevent pregnancy. It can be used for up to 3 years. This medicine may be used for other purposes; ask your health care provider or pharmacist if you have questions. What should I tell my health care provider before I take this medicine? They need to know if you have any of these conditions: -abnormal vaginal bleeding -blood vessel disease or blood clots -cancer of the breast, cervix, or liver -depression -diabetes -gallbladder disease -headaches -heart disease or recent heart attack -high blood pressure -high cholesterol -kidney disease -liver disease -renal disease -seizures -tobacco smoker -an unusual or allergic reaction to etonogestrel, other hormones, anesthetics or antiseptics, medicines, foods, dyes, or preservatives -pregnant or trying to get pregnant -breast-feeding How should I use this medicine? This device is inserted just under the skin on the inner side of your upper arm by a  health care professional. Talk to your pediatrician regarding the use of this medicine in children. Special care may be needed. Overdosage: If you think you have taken too much of this medicine contact a poison control center or emergency room at once. NOTE: This medicine is only for you. Do not share this medicine with others. What if I miss a dose? This does not apply. What may interact with this medicine? Do not take this medicine with any of the following medications: -amprenavir -bosentan -fosamprenavir This medicine may also interact with the following medications: -barbiturate medicines for inducing sleep or treating seizures -certain medicines for fungal infections like ketoconazole and itraconazole -griseofulvin -medicines to treat seizures like carbamazepine, felbamate, oxcarbazepine, phenytoin, topiramate -modafinil -phenylbutazone -rifampin -some medicines to treat HIV infection like atazanavir, indinavir, lopinavir, nelfinavir, tipranavir, ritonavir -St. John's wort This list may not describe all possible interactions. Give your health care provider a list of all the medicines, herbs, non-prescription drugs, or dietary supplements you use. Also tell them if you smoke, drink alcohol, or use illegal drugs. Some items may interact with your medicine. What should I watch for while using this medicine? This product does not protect you against HIV infection (AIDS) or other sexually transmitted diseases. You should be able to feel the implant by pressing your fingertips over the skin where it was inserted. Contact your doctor if you cannot feel the implant, and use a non-hormonal birth control method (such as condoms) until your doctor confirms that the implant is in place. If you feel that the implant may have broken or become bent  while in your arm, contact your healthcare provider. What side effects may I notice from receiving this medicine? Side effects that you should report to  your doctor or health care professional as soon as possible: -allergic reactions like skin rash, itching or hives, swelling of the face, lips, or tongue -breast lumps -changes in emotions or moods -depressed mood -heavy or prolonged menstrual bleeding -pain, irritation, swelling, or bruising at the insertion site -scar at site of insertion -signs of infection at the insertion site such as fever, and skin redness, pain or discharge -signs of pregnancy -signs and symptoms of a blood clot such as breathing problems; changes in vision; chest pain; severe, sudden headache; pain, swelling, warmth in the leg; trouble speaking; sudden numbness or weakness of the face, arm or leg -signs and symptoms of liver injury like dark yellow or brown urine; general ill feeling or flu-like symptoms; light-colored stools; loss of appetite; nausea; right upper belly pain; unusually weak or tired; yellowing of the eyes or skin -unusual vaginal bleeding, discharge -signs and symptoms of a stroke like changes in vision; confusion; trouble speaking or understanding; severe headaches; sudden numbness or weakness of the face, arm or leg; trouble walking; dizziness; loss of balance or coordination Side effects that usually do not require medical attention (Report these to your doctor or health care professional if they continue or are bothersome.): -acne -back pain -breast pain -changes in weight -dizziness -general ill feeling or flu-like symptoms -headache -irregular menstrual bleeding -nausea -sore throat -vaginal irritation or inflammation This list may not describe all possible side effects. Call your doctor for medical advice about side effects. You may report side effects to FDA at 1-800-FDA-1088. Where should I keep my medicine? This drug is given in a hospital or clinic and will not be stored at home. NOTE: This sheet is a summary. It may not cover all possible information. If you have questions about this  medicine, talk to your doctor, pharmacist, or health care provider.    2016, Elsevier/Gold Standard. (2014-05-05 14:07:06)

## 2016-05-08 ENCOUNTER — Encounter: Payer: Self-pay | Admitting: Women's Health

## 2016-05-08 ENCOUNTER — Ambulatory Visit (INDEPENDENT_AMBULATORY_CARE_PROVIDER_SITE_OTHER): Payer: Medicaid Other | Admitting: Women's Health

## 2016-05-08 VITALS — BP 132/80 | HR 80 | Ht 61.0 in | Wt 172.2 lb

## 2016-05-08 DIAGNOSIS — Z3202 Encounter for pregnancy test, result negative: Secondary | ICD-10-CM | POA: Diagnosis not present

## 2016-05-08 DIAGNOSIS — Z3049 Encounter for surveillance of other contraceptives: Secondary | ICD-10-CM

## 2016-05-08 DIAGNOSIS — Z30017 Encounter for initial prescription of implantable subdermal contraceptive: Secondary | ICD-10-CM | POA: Insufficient documentation

## 2016-05-08 LAB — POCT URINE PREGNANCY: PREG TEST UR: NEGATIVE

## 2016-05-08 NOTE — Progress Notes (Signed)
Julia Castro is a 25 y.o. year old [place] female here for Nexplanon insertion.  Patient's last menstrual period was 05/03/2016., last sexual intercourse was 9/21, and her pregnancy test today was negative. LMP 9/30, and hasn't had sex since.  Risks/benefits/side effects of Nexplanon have been discussed and her questions have been answered.  Specifically, a failure rate of 08/998 has been reported, with an increased failure rate if pt takes St. John's Wort and/or antiseizure medicaitons.  [name] is aware of the common side effect of irregular bleeding, which the incidence of decreases over time.  BP 132/80   Pulse 80   Ht 5\' 1"  (1.549 m)   Wt 172 lb 3.2 oz (78.1 kg)   LMP 05/03/2016   BMI 32.54 kg/m   Results for orders placed or performed in visit on 05/08/16 (from the past 24 hour(s))  POCT urine pregnancy   Collection Time: 05/08/16  2:53 PM  Result Value Ref Range   Preg Test, Ur Negative Negative     She is right-handed, so her left arm, approximately 4 inches proximal from the elbow, was cleansed with alcohol and anesthetized with 2cc of 2% Lidocaine.  The area was cleansed again with betadine and the Nexplanon was inserted per manufacturer's recommendations without difficulty.  A steri-strip and pressure bandage were applied.  Pt was instructed to keep the area clean and dry, remove pressure bandage in 24 hours, and keep insertion site covered with the steri-strip for 3-5 days.  Back up contraception was recommended for 2 weeks.  She was given a card indicating date Nexplanon was inserted and date it needs to be removed. Follow-up PRN problems.  Marge DuncansBooker, Duriel Deery Randall CNM, [facility] 05/08/2016 3:20 PM

## 2016-05-08 NOTE — Patient Instructions (Signed)

## 2016-08-18 ENCOUNTER — Encounter: Payer: Self-pay | Admitting: *Deleted

## 2016-08-18 ENCOUNTER — Ambulatory Visit: Payer: Medicaid Other | Admitting: Women's Health

## 2016-09-22 ENCOUNTER — Ambulatory Visit: Payer: Medicaid Other | Admitting: Obstetrics & Gynecology

## 2016-09-30 ENCOUNTER — Other Ambulatory Visit: Payer: Medicaid Other | Admitting: Advanced Practice Midwife

## 2016-10-09 ENCOUNTER — Other Ambulatory Visit: Payer: Medicaid Other | Admitting: Advanced Practice Midwife

## 2016-10-22 ENCOUNTER — Encounter: Payer: Self-pay | Admitting: *Deleted

## 2016-10-22 ENCOUNTER — Other Ambulatory Visit: Payer: Medicaid Other | Admitting: Advanced Practice Midwife

## 2017-08-13 ENCOUNTER — Ambulatory Visit (INDEPENDENT_AMBULATORY_CARE_PROVIDER_SITE_OTHER): Payer: Self-pay | Admitting: Advanced Practice Midwife

## 2017-08-13 ENCOUNTER — Other Ambulatory Visit: Payer: Self-pay

## 2017-08-13 ENCOUNTER — Other Ambulatory Visit (HOSPITAL_COMMUNITY)
Admission: RE | Admit: 2017-08-13 | Discharge: 2017-08-13 | Disposition: A | Discharge status: Home / Self Care | Payer: Self-pay | Source: Ambulatory Visit | Attending: Advanced Practice Midwife | Admitting: Advanced Practice Midwife

## 2017-08-13 ENCOUNTER — Encounter (INDEPENDENT_AMBULATORY_CARE_PROVIDER_SITE_OTHER): Payer: Self-pay

## 2017-08-13 ENCOUNTER — Encounter: Payer: Self-pay | Admitting: Advanced Practice Midwife

## 2017-08-13 VITALS — BP 126/82 | HR 77 | Ht 61.0 in | Wt 182.0 lb

## 2017-08-13 DIAGNOSIS — Z01419 Encounter for gynecological examination (general) (routine) without abnormal findings: Secondary | ICD-10-CM

## 2017-08-13 NOTE — Patient Instructions (Signed)
Contraception Choices Contraception, also called birth control, refers to methods or devices that prevent pregnancy. Hormonal methods Contraceptive implant A contraceptive implant is a thin, plastic tube that contains a hormone. It is inserted into the upper part of the arm. It can remain in place for up to 3 years. Progestin-only injections Progestin-only injections are injections of progestin, a synthetic form of the hormone progesterone. They are given every 3 months by a health care provider. Birth control pills Birth control pills are pills that contain hormones that prevent pregnancy. They must be taken once a day, preferably at the same time each day. Birth control patch The birth control patch contains hormones that prevent pregnancy. It is placed on the skin and must be changed once a week for three weeks and removed on the fourth week. A prescription is needed to use this method of contraception. Vaginal ring A vaginal ring contains hormones that prevent pregnancy. It is placed in the vagina for three weeks and removed on the fourth week. After that, the process is repeated with a new ring. A prescription is needed to use this method of contraception. Emergency contraceptive Emergency contraceptives prevent pregnancy after unprotected sex. They come in pill form and can be taken up to 5 days after sex. They work best the sooner they are taken after having sex. Most emergency contraceptives are available without a prescription. This method should not be used as your only form of birth control. Barrier methods Female condom A female condom is a thin sheath that is worn over the penis during sex. Condoms keep sperm from going inside a woman's body. They can be used with a spermicide to increase their effectiveness. They should be disposed after a single use. Female condom A female condom is a soft, loose-fitting sheath that is put into the vagina before sex. The condom keeps sperm from going  inside a woman's body. They should be disposed after a single use. Diaphragm A diaphragm is a soft, dome-shaped barrier. It is inserted into the vagina before sex, along with a spermicide. The diaphragm blocks sperm from entering the uterus, and the spermicide kills sperm. A diaphragm should be left in the vagina for 6-8 hours after sex and removed within 24 hours. A diaphragm is prescribed and fitted by a health care provider. A diaphragm should be replaced every 1-2 years, after giving birth, after gaining more than 15 lb (6.8 kg), and after pelvic surgery. Cervical cap A cervical cap is a round, soft latex or plastic cup that fits over the cervix. It is inserted into the vagina before sex, along with spermicide. It blocks sperm from entering the uterus. The cap should be left in place for 6-8 hours after sex and removed within 48 hours. A cervical cap must be prescribed and fitted by a health care provider. It should be replaced every 2 years. Sponge A sponge is a soft, circular piece of polyurethane foam with spermicide on it. The sponge helps block sperm from entering the uterus, and the spermicide kills sperm. To use it, you make it wet and then insert it into the vagina. It should be inserted before sex, left in for at least 6 hours after sex, and removed and thrown away within 30 hours. Spermicides Spermicides are chemicals that kill or block sperm from entering the cervix and uterus. They can come as a cream, jelly, suppository, foam, or tablet. A spermicide should be inserted into the vagina with an applicator at least 10-15 minutes before   sex to allow time for it to work. The process must be repeated every time you have sex. Spermicides do not require a prescription. Intrauterine contraception Intrauterine device (IUD) An IUD is a T-shaped device that is put in a woman's uterus. There are two types:  Hormone IUD.This type contains progestin, a synthetic form of the hormone progesterone. This  type can stay in place for 3-5 years.  Copper IUD.This type is wrapped in copper wire. It can stay in place for 10 years.  Permanent methods of contraception Female tubal ligation In this method, a woman's fallopian tubes are sealed, tied, or blocked during surgery to prevent eggs from traveling to the uterus. Hysteroscopic sterilization In this method, a small, flexible insert is placed into each fallopian tube. The inserts cause scar tissue to form in the fallopian tubes and block them, so sperm cannot reach an egg. The procedure takes about 3 months to be effective. Another form of birth control must be used during those 3 months. Female sterilization This is a procedure to tie off the tubes that carry sperm (vasectomy). After the procedure, the man can still ejaculate fluid (semen). Natural planning methods Natural family planning In this method, a couple does not have sex on days when the woman could become pregnant. Calendar method This means keeping track of the length of each menstrual cycle, identifying the days when pregnancy can happen, and not having sex on those days. Ovulation method In this method, a couple avoids sex during ovulation. Symptothermal method This method involves not having sex during ovulation. The woman typically checks for ovulation by watching changes in her temperature and in the consistency of cervical mucus. Post-ovulation method In this method, a couple waits to have sex until after ovulation. Summary  Contraception, also called birth control, means methods or devices that prevent pregnancy.  Hormonal methods of contraception include implants, injections, pills, patches, vaginal rings, and emergency contraceptives.  Barrier methods of contraception can include female condoms, female condoms, diaphragms, cervical caps, sponges, and spermicides.  There are two types of IUDs (intrauterine devices). An IUD can be put in a woman's uterus to prevent pregnancy  for 3-5 years.  Permanent sterilization can be done through a procedure for males, females, or both.  Natural family planning methods involve not having sex on days when the woman could become pregnant. This information is not intended to replace advice given to you by your health care provider. Make sure you discuss any questions you have with your health care provider. Document Released: 07/21/2005 Document Revised: 08/23/2016 Document Reviewed: 08/23/2016 Elsevier Interactive Patient Education  2018 [employer].  

## 2017-08-13 NOTE — Progress Notes (Signed)
Julia Castro 27 y.o.  Vitals:   08/13/17 0836  BP: 126/82  Pulse: 77     Filed Weights   08/13/17 0836  Weight: 182 lb (82.6 kg)    Past Medical History: Past Medical History:  Diagnosis Date  . Chlamydia   . Heart murmur    Benign  . Panic attacks     Past Surgical History: Past Surgical History:  Procedure Laterality Date  . INDUCED ABORTION  09/2013  . WISDOM TOOTH EXTRACTION      Family History: Family History  Problem Relation Age of Onset  . Other Mother        heart issues  . Seizures Father   . Cancer Sister   . Mental retardation Brother   . Mental illness Maternal Grandmother   . Anxiety disorder Maternal Grandmother   . Alcohol abuse Maternal Grandfather        in the past  . Anxiety disorder Paternal Grandmother   . Other Brother        MVA  . SIDS Sister   . Anesthesia problems Neg Hx     Social History: Social History   Tobacco Use  . Smoking status: Former Smoker    Years: 3.00    Types: Cigars  . Smokeless tobacco: Never Used  . Tobacco comment: smokes 1 daily  Substance Use Topics  . Alcohol use: Yes    Alcohol/week: 0.6 oz    Types: 1 Glasses of wine per week  . Drug use: No    Allergies: No Known Allergies   No current outpatient medications on file.  History of Present Illness: here for pap and physical. Last pap 9/16, normal.  [facility] Says over the past 4 months, she has been having arm pain that radiates to her shoulder/back. Also bleeds twice a month. Plans on getting FP medicaid, may want BTL.  Aware of possible regret factor. Scared of and IUD.  Has gained aboaut 10 lbs.  Discussed keto/low carb. .     Review of Systems   Patient denies any headaches, blurred vision, shortness of breath, chest pain, abdominal pain, problems with bowel movements, urination, or intercourse.   Physical Exam: General:  Well developed, well nourished, no acute distress Skin:  Warm and dry Neck:  Midline trachea,  normal thyroid Lungs; Clear to auscultation bilaterally Breast:  No dominant palpable mass, retraction, or nipple discharge Cardiovascular: Regular rate and rhythm Abdomen:  Soft, non tender, no hepatosplenomegaly Pelvic:  External genitalia is normal in appearance.  The vagina is normal in appearance.  The cervix is bulbous.  Uterus is felt to be normal size, shape, and contour.  No adnexal masses or tenderness noted. Exam limited by habitus Extremities:  No swelling or varicosities noted Psych:  No mood changes.     Impression: normal GYN exam     Plan: if normal , pap q 3 years  Self pay, declined gc/chl.  Let us know about possible nexplaon removal/BTL

## 2017-08-17 ENCOUNTER — Other Ambulatory Visit: Payer: Self-pay

## 2017-08-17 ENCOUNTER — Emergency Department (HOSPITAL_COMMUNITY): Payer: Self-pay

## 2017-08-17 ENCOUNTER — Encounter (HOSPITAL_COMMUNITY): Payer: Self-pay | Admitting: *Deleted

## 2017-08-17 ENCOUNTER — Emergency Department (HOSPITAL_COMMUNITY)
Admission: EM | Admit: 2017-08-17 | Discharge: 2017-08-17 | Disposition: A | Discharge status: Home / Self Care | Payer: Self-pay | Attending: Emergency Medicine | Admitting: Emergency Medicine

## 2017-08-17 DIAGNOSIS — Y939 Activity, unspecified: Secondary | ICD-10-CM | POA: Insufficient documentation

## 2017-08-17 DIAGNOSIS — Y929 Unspecified place or not applicable: Secondary | ICD-10-CM | POA: Insufficient documentation

## 2017-08-17 DIAGNOSIS — Y999 Unspecified external cause status: Secondary | ICD-10-CM | POA: Insufficient documentation

## 2017-08-17 DIAGNOSIS — W2203XA Walked into furniture, initial encounter: Secondary | ICD-10-CM | POA: Insufficient documentation

## 2017-08-17 DIAGNOSIS — S91115A Laceration without foreign body of left lesser toe(s) without damage to nail, initial encounter: Secondary | ICD-10-CM | POA: Insufficient documentation

## 2017-08-17 DIAGNOSIS — Z87891 Personal history of nicotine dependence: Secondary | ICD-10-CM | POA: Insufficient documentation

## 2017-08-17 DIAGNOSIS — S92525A Nondisplaced fracture of medial phalanx of left lesser toe(s), initial encounter for closed fracture: Secondary | ICD-10-CM | POA: Insufficient documentation

## 2017-08-17 LAB — CYTOLOGY - PAP
ADEQUACY: ABSENT
Chlamydia: NEGATIVE
Diagnosis: NEGATIVE
Neisseria Gonorrhea: NEGATIVE

## 2017-08-17 MED ORDER — LIDOCAINE HCL (PF) 2 % IJ SOLN
10.0000 mL | Freq: Once | INTRAMUSCULAR | Status: AC
  Administered 2017-08-17: 10 mL

## 2017-08-17 MED ORDER — TRAMADOL HCL 50 MG PO TABS: 50.0000 mg | ORAL_TABLET | Freq: Four times a day (QID) | ORAL | 0 refills | Status: DC | PRN

## 2017-08-17 MED ORDER — LIDOCAINE HCL (PF) 2 % IJ SOLN
INTRAMUSCULAR | Status: AC
  Filled 2017-08-17: qty 20

## 2017-08-17 MED ORDER — ACETAMINOPHEN 325 MG PO TABS
650.0000 mg | ORAL_TABLET | Freq: Once | ORAL | Status: AC
  Administered 2017-08-17: 650 mg via ORAL
  Filled 2017-08-17: qty 2

## 2017-08-17 MED ORDER — IBUPROFEN 800 MG PO TABS
800.0000 mg | ORAL_TABLET | Freq: Once | ORAL | Status: AC
  Administered 2017-08-17: 800 mg via ORAL
  Filled 2017-08-17: qty 1

## 2017-08-17 MED ORDER — HYDROGEN PEROXIDE 3 % EX SOLN
CUTANEOUS | Status: AC
  Filled 2017-08-17: qty 473

## 2017-08-17 NOTE — ED Provider Notes (Signed)
[facility] EMERGENCY DEPARTMENT Provider Note   CSN: [id number] Arrival date & time: 08/17/17  4098     History   Chief Complaint Chief Complaint  Patient presents with  . Foot Pain    HPI Julia Castro is a 27 y.o. female.  Patient presents approximately 1 hour after injuring her left foot.  Patient reports that she was walking and she accidentally hit her foot on a piece of furniture.  She was barefoot at the time.  She injured her left third toe.  Patient reports severe pain in the toe and bleeding from the bottom part of the toe.      Past Medical History:  Diagnosis Date  . Chlamydia   . Heart murmur    Benign  . Panic attacks     Patient Active Problem List   Diagnosis Date Noted  . Nexplanon insertion 05/08/2016  . Pica 01/09/2016  . Sickle cell trait (HCC) 10/01/2015  . Smoker 08/29/2015  . Anxiety w/ panic attacks 08/29/2015    Past Surgical History:  Procedure Laterality Date  . INDUCED ABORTION  09/2013  . WISDOM TOOTH EXTRACTION      OB History    Gravida Para Term Preterm AB Living   4 3 3  0 1 3   SAB TAB Ectopic Multiple Live Births   0 0 0 0 3       Home Medications    Prior to Admission medications   Medication Sig Start Date End Date Taking? Authorizing Provider  traMADol (ULTRAM) 50 MG tablet Take 1 tablet (50 mg total) by mouth every 6 (six) hours as needed. 08/17/17   Gilda Crease, MD    Family History Family History  Problem Relation Age of Onset  . Other Mother        heart issues  . Seizures Father   . Cancer Sister   . Mental retardation Brother   . Mental illness Maternal Grandmother   . Anxiety disorder Maternal Grandmother   . Alcohol abuse Maternal Grandfather        in the past  . Anxiety disorder Paternal Grandmother   . Other Brother        MVA  . SIDS Sister   . Anesthesia problems Neg Hx     Social History Social History   Tobacco Use  . Smoking status: Former Smoker    Years: 3.00   Types: Cigars  . Smokeless tobacco: Never Used  . Tobacco comment: smokes 1 daily  Substance Use Topics  . Alcohol use: Yes    Alcohol/week: 0.6 oz    Types: 1 Glasses of wine per week  . Drug use: No     Allergies   Patient has no known allergies.   Review of Systems Review of Systems  Musculoskeletal:       Toe pain  Skin: Positive for wound.     Physical Exam Updated Vital Signs BP 130/80 (BP Location: Left Arm)   Pulse 95   Temp 98 F (36.7 C) (Oral)   Resp 18   Ht 5\' 1"  (1.549 m)   Wt 82.6 kg (182 lb)   LMP 08/06/2017 (Approximate)   SpO2 99%   BMI 34.39 kg/m   Physical Exam  Constitutional: She appears well-developed.  HENT:  Head: Atraumatic.  Eyes: EOM are normal.  Pulmonary/Chest: Effort normal.  Musculoskeletal:       Left foot: There is decreased range of motion (Third toe), tenderness (Third toe) and laceration (  Plantar aspect of third toe at base).     ED Treatments / Results  Labs (all labs ordered are listed, but only abnormal results are displayed) Labs Reviewed - No data to display  EKG  EKG Interpretation None       Radiology Dg Toe 3rd Left  Result Date: 08/17/2017 CLINICAL DATA:  Blunt trauma third toe.  Laceration. EXAM: LEFT THIRD TOE COMPARISON:  None. FINDINGS: Acute nondisplaced fracture fragment from medial aspect third middle phalanx. Additional linear lucency through distal aspects third proximal phalanx though this appears to extend past the cortex and may be artifact. No dislocation. No destructive bony lesions. Soft tissue planes are nonsuspicious. IMPRESSION: Acute nondisplaced third middle phalanx fracture. Nondisplaced third proximal phalanx fracture versus artifact. No dislocation. Electronically Signed   By: Awilda Metroourtnay  Bloomer M.D.   On: 08/17/2017 04:30    Procedures .Marland Kitchen.Laceration Repair Date/Time: 08/17/2017 5:45 AM Performed by: Gilda CreasePollina, Christopher J, MD Authorized by: Gilda CreasePollina, Christopher J, MD   Consent:     Consent obtained:  Verbal   Consent given by:  Patient   Risks discussed:  Pain and infection Universal protocol:    Procedure explained and questions answered to patient or proxy's satisfaction: yes     Relevant documents present and verified: yes     Test results available and properly labeled: yes     Imaging studies available: yes     Required blood products, implants, devices, and special equipment available: yes     Site/side marked: yes     Immediately prior to procedure, a time out was called: yes     Patient identity confirmed:  Verbally with patient Anesthesia (see MAR for exact dosages):    Anesthesia method:  Nerve block   Block location:  Digital block   Block anesthetic:  Lidocaine 2% w/o epi   Block technique:  Standard digital   Block injection procedure:  Anatomic landmarks identified, introduced needle, incremental injection, negative aspiration for blood and anatomic landmarks palpated   Block outcome:  Anesthesia achieved Laceration details:    Location:  Toe   Toe location:  L third toe   Length (cm):  1.5 Pre-procedure details:    Preparation:  Patient was prepped and draped in usual sterile fashion and imaging obtained to evaluate for foreign bodies Exploration:    Hemostasis achieved with:  Direct pressure   Wound exploration: wound explored through full range of motion     Contaminated: no   Treatment:    Area cleansed with:  Betadine   Amount of cleaning:  Standard   Irrigation solution:  Sterile saline Skin repair:    Repair method:  Sutures   Suture size:  4-0   Suture material:  Prolene   Number of sutures:  3 Approximation:    Approximation:  Close   Vermilion border: well-aligned   Post-procedure details:    Patient tolerance of procedure:  Tolerated well, no immediate complications   (including critical care time)  Medications Ordered in ED Medications  hydrogen peroxide 3 % external solution (not administered)  lidocaine (XYLOCAINE) 2  % injection (not administered)  ibuprofen (ADVIL,MOTRIN) tablet 800 mg (800 mg Oral Given 08/17/17 0359)  acetaminophen (TYLENOL) tablet 650 mg (650 mg Oral Given 08/17/17 0359)  lidocaine (XYLOCAINE) 2 % injection 10 mL (10 mLs Infiltration Given 08/17/17 0508)     Initial Impression / Assessment and Plan / ED Course  I have reviewed the triage vital signs and the nursing notes.  Pertinent labs &  imaging results that were available during my care of the patient were reviewed by me and considered in my medical decision making (see chart for details).     Presented with injury to left third toe.  Patient suffered blunt trauma causing the toe to bend backwards.  She has a nondisplaced fracture of the middle phalanx and also there was a laceration on the plantar aspect of the ace of the toe.  Sutures were placed in this area.  Patient will have sutures removed in 10 days.  Final Clinical Impressions(s) / ED Diagnoses   Final diagnoses:  Closed nondisplaced fracture of middle phalanx of lesser toe of left foot, initial encounter  Laceration of lesser toe of left foot without foreign body present or damage to nail, initial encounter    ED Discharge Orders        Ordered    traMADol (ULTRAM) 50 MG tablet  Every 6 hours PRN     08/17/17 0549       Gilda Crease, MD 08/17/17 [phone]

## 2017-08-17 NOTE — ED Triage Notes (Signed)
Pt states she hit her left foot on the corner of a piece of furniture; pt has bleeding and deformity noted to left foot

## 2017-08-17 NOTE — Discharge Instructions (Signed)
See your doctor, go to urgent care or return to this ER in 10 days for suture removal

## 2018-03-02 ENCOUNTER — Ambulatory Visit: Payer: Medicaid Other | Admitting: Advanced Practice Midwife

## 2018-03-02 ENCOUNTER — Encounter: Payer: Self-pay | Admitting: Advanced Practice Midwife

## 2018-03-02 VITALS — BP 122/79 | HR 63 | Ht 61.0 in | Wt 184.0 lb

## 2018-03-02 DIAGNOSIS — Z3009 Encounter for other general counseling and advice on contraception: Secondary | ICD-10-CM

## 2018-03-02 NOTE — Progress Notes (Signed)
[facility] Visit  Patient name: Julia Castro MRN [id number]  Date of birth: 08/29/1990  CC & HPI:  Julia Castro is a 27 y.o.  female [facility] Wants [facility] Nexplanon still giving her tingling in left arm.  Didn't know that she had medicaid.  BTL risks/SE/benefits/permanecy discussed.  "100% wants it done".    Pertinent History Reviewed:  Medical & Surgical Hx:   Past Medical History:  Diagnosis Date  . Chlamydia   . Heart murmur    Benign  . Panic attacks    Past Surgical History:  Procedure Laterality Date  . INDUCED ABORTION  09/2013  . WISDOM TOOTH EXTRACTION     Family History  Problem Relation Age of Onset  . Other Mother        heart issues  . Seizures Father   . Cancer Sister   . Mental retardation Brother   . Mental illness Maternal Grandmother   . Anxiety disorder Maternal Grandmother   . Alcohol abuse Maternal Grandfather        in the past  . Anxiety disorder Paternal Grandmother   . Other Brother        MVA  . SIDS Sister   . Anesthesia problems Neg Hx     Current Outpatient Medications:  .  traMADol (ULTRAM) 50 MG tablet, Take 1 tablet (50 mg total) by mouth every 6 (six) hours as needed. (Patient not taking: Reported on 03/02/2018), Disp: 15 tablet, Rfl: 0 Social History: Reviewed -  reports that she has quit smoking. Her smoking use included cigars. She quit after 3.00 years of use. She has never used smokeless tobacco.  Review of Systems:   Constitutional: Negative for fever and chills Eyes: Negative for visual disturbances Respiratory: Negative for shortness of breath, dyspnea Cardiovascular: Negative for chest pain or palpitations  Gastrointestinal: Negative for vomiting, diarrhea and constipation; no abdominal pain Genitourinary: Negative for dysuria and urgency, vaginal irritation or itching Musculoskeletal: Negative for back pain, joint pain, myalgias  Neurological: Negative for dizziness and headaches    Objective  Findings:    Physical Examination: Vitals:   03/02/18 1336  BP: 122/79  Pulse: 63   General appearance - well appearing, and in no distress Mental status - alert, oriented to person, place, and time Chest:  Normal respiratory effort Heart - normal rate and regular rhythm Abdomen:  Soft, nontender Pelvic: deferred Musculoskeletal:  Normal range of motion without pain Extremities:  No edema    No results found for this or any previous visit (from the past 24 hour(s)).    Assessment & Plan:  A:   Contraception mgt P:  BTL papers signed today.  Pt to call 8/1 and make sure medicaid will still be active (if not, will quality for FP, so apply for that).  If it is active, make appt w/MD of choice for preop BTL   No follow-ups on file.  Jacklyn ShellFrances Cresenzo-Dishmon CNM 03/02/2018 5:04 PM

## 2018-03-12 ENCOUNTER — Ambulatory Visit: Payer: Medicaid Other | Admitting: Obstetrics & Gynecology

## 2018-03-12 ENCOUNTER — Encounter: Payer: Self-pay | Admitting: Obstetrics & Gynecology

## 2018-03-12 VITALS — BP 123/80 | HR 60 | Ht 61.0 in | Wt 184.0 lb

## 2018-03-12 DIAGNOSIS — Z302 Encounter for sterilization: Secondary | ICD-10-CM | POA: Diagnosis not present

## 2018-03-12 DIAGNOSIS — Z3009 Encounter for other general counseling and advice on contraception: Secondary | ICD-10-CM

## 2018-03-12 NOTE — Progress Notes (Signed)
Preoperative History and Physical  Julia Castro is a 27 y.o. [id number] with Patient's last menstrual period was 02/28/2018. admitted for a laparoscopic bilateral tubal ligation using electrocautery and removal of nexplanon.  [id number]   PMH:    Past Medical History:  Diagnosis Date  . Chlamydia   . Heart murmur    Benign  . Panic attacks     PSH:     Past Surgical History:  Procedure Laterality Date  . INDUCED ABORTION  09/2013  . WISDOM TOOTH EXTRACTION      POb/GynH:      OB History    Gravida  4   Para  3   Term  3   Preterm  0   AB  1   Living  3     SAB  0   TAB  0   Ectopic  0   Multiple  0   Live Births  3           SH:   Social History   Tobacco Use  . Smoking status: Former Smoker    Years: 3.00    Types: Cigars  . Smokeless tobacco: Never Used  . Tobacco comment: smokes 1 daily  Substance Use Topics  . Alcohol use: Yes    Alcohol/week: 1.0 standard drinks    Types: 1 Glasses of wine per week  . Drug use: No    FH:    Family History  Problem Relation Age of Onset  . Other Mother        heart issues  . Seizures Father   . Cancer Sister   . Mental retardation Brother   . Mental illness Maternal Grandmother   . Anxiety disorder Maternal Grandmother   . Alcohol abuse Maternal Grandfather        in the past  . Anxiety disorder Paternal Grandmother   . Other Brother        MVA  . SIDS Sister   . Anesthesia problems Neg Hx      Allergies: No Known Allergies  Medications:       Current Outpatient Medications:  .  traMADol (ULTRAM) 50 MG tablet, Take 1 tablet (50 mg total) by mouth every 6 (six) hours as needed. (Patient not taking: Reported on 03/02/2018), Disp: 15 tablet, Rfl: 0  Review of Systems:   Review of Systems  Constitutional: Negative for fever, chills, weight loss, malaise/fatigue and diaphoresis.  HENT: Negative for hearing loss, ear pain, nosebleeds, congestion, sore throat, neck pain, tinnitus and ear  discharge.   Eyes: Negative for blurred vision, double vision, photophobia, pain, discharge and redness.  Respiratory: Negative for cough, hemoptysis, sputum production, shortness of breath, wheezing and stridor.   Cardiovascular: Negative for chest pain, palpitations, orthopnea, claudication, leg swelling and PND.  Gastrointestinal: Positive for abdominal pain. Negative for heartburn, nausea, vomiting, diarrhea, constipation, blood in stool and melena.  Genitourinary: Negative for dysuria, urgency, frequency, hematuria and flank pain.  Musculoskeletal: Negative for myalgias, back pain, joint pain and falls.  Skin: Negative for itching and rash.  Neurological: Negative for dizziness, tingling, tremors, sensory change, speech change, focal weakness, seizures, loss of consciousness, weakness and headaches.  Endo/Heme/Allergies: Negative for environmental allergies and polydipsia. Does not bruise/bleed easily.  Psychiatric/Behavioral: Negative for depression, suicidal ideas, hallucinations, memory loss and substance abuse. The patient is not nervous/anxious and does not have insomnia.      PHYSICAL EXAM:  Blood pressure 123/80, pulse 60, height 5\' 1"  (1.549 m), weight  184 lb (83.5 kg), last menstrual period 02/28/2018, not currently breastfeeding.    Vitals reviewed. Constitutional: She is oriented to person, place, and time. She appears well-developed and well-nourished.  HENT:  Head: Normocephalic and atraumatic.  Right Ear: External ear normal.  Left Ear: External ear normal.  Nose: Nose normal.  Mouth/Throat: Oropharynx is clear and moist.  Eyes: Conjunctivae and EOM are normal. Pupils are equal, round, and reactive to light. Right eye exhibits no discharge. Left eye exhibits no discharge. No scleral icterus.  Neck: Normal range of motion. Neck supple. No tracheal deviation present. No thyromegaly present.  Cardiovascular: Normal rate, regular rhythm, normal heart sounds and intact  distal pulses.  Exam reveals no gallop and no friction rub.   No murmur heard. Respiratory: Effort normal and breath sounds normal. No respiratory distress. She has no wheezes. She has no rales. She exhibits no tenderness.  GI: Soft. Bowel sounds are normal. She exhibits no distension and no mass. There is tenderness. There is no rebound and no guarding.  Genitourinary:       Vulva is normal without lesions Vagina is pink moist without discharge Cervix normal in appearance and pap is normal Uterus is normal size, contour, position, consistency, mobility, non-tender Adnexa is negative with normal sized ovaries by sonogram  Musculoskeletal: Normal range of motion. She exhibits no edema and no tenderness.  Neurological: She is alert and oriented to person, place, and time. She has normal reflexes. She displays normal reflexes. No cranial nerve deficit. She exhibits normal muscle tone. Coordination normal.  Skin: Skin is warm and dry. No rash noted. No erythema. No pallor.  Psychiatric: She has a normal mood and affect. Her behavior is normal. Judgment and thought content normal.    Labs: No results found for this or any previous visit (from the past 336 hour(s)).  EKG: Orders placed or performed during the hospital encounter of 02/23/16  . EKG 12-Lead  . EKG 12-Lead  . EKG    Imaging Studies: No results found.    Assessment: Desires permanent steriization and removal of nexplanon   Patient Active Problem List   Diagnosis Date Noted  . Nexplanon insertion 05/08/2016  . Pica 01/09/2016  . Sickle cell trait (HCC) 10/01/2015  . Smoker 08/29/2015  . Anxiety w/ panic attacks 08/29/2015    Plan: Laparoscopic bilateral tubal ligation using electrocautery, removal of nexplanon 04/14/2018  Amaryllis DykeLuther H Sabriah Hobbins 03/24/2018 9:15 PM      Face to face time:  15 minutes  Greater than 50% of the visit time was spent in counseling and coordination of care with the patient.  The summary and  outline of the counseling and care coordination is summarized in the note above.   All questions were answered.

## 2018-03-24 ENCOUNTER — Encounter: Payer: Self-pay | Admitting: Obstetrics & Gynecology

## 2018-04-06 NOTE — Patient Instructions (Addendum)
Your procedure is scheduled on: 04/14/2018  Report to [facility] at 10:30    AM.  Call this number if you have problems the morning of surgery: [phone]   Remember:   Do not drink or eat food:After Midnight.  :  Take these medicines the morning of surgery with A SIP OF WATER: none   Do not wear jewelry, make-up or nail polish.  Do not wear lotions, powders, or perfumes. You may wear deodorant.  Do not shave 48 hours prior to surgery. Men may shave face and neck.  Do not bring valuables to the hospital.  Contacts, dentures or bridgework may not be worn into surgery.  Leave suitcase in the car. After surgery it may be brought to your room.  For patients admitted to the hospital, checkout time is 11:00 AM the day of discharge.   Patients discharged the day of surgery will not be allowed to drive home.    Special Instructions: Shower using CHG night before surgery and shower the day of surgery use CHG.  Use special wash - you have one bottle of CHG for all showers.  You should use approximately 1/2 of the bottle for each shower.  Laparoscopic Tubal Ligation Laparoscopic tubal ligation is a procedure to close the fallopian tubes. This is done so that you cannot get pregnant. When the fallopian tubes are closed, the eggs that your ovaries release cannot enter the uterus, and sperm cannot reach the released eggs. A laparoscopic tubal ligation is sometimes called "getting your tubes tied." You should not have this procedure if you want to get pregnant someday or if you are unsure about having more children. Tell a health care provider about:  Any allergies you have.  All medicines you are taking, including vitamins, herbs, eye drops, creams, and over-the-counter medicines.  Any problems you or family members have had with anesthetic medicines.  Any blood disorders you have.  Any surgeries you have had.  Any medical conditions you have.  Whether you are pregnant or may be  pregnant.  Any past pregnancies. What are the risks? Generally, this is a safe procedure. However, problems may occur, including:  Infection.  Bleeding.  Injury to surrounding organs.  Side effects from anesthetics.  Failure of the procedure.  This procedure can increase your risk of a kind of pregnancy in which a fertilized egg attaches to the outside of the uterus (ectopic pregnancy). What happens before the procedure?  Ask your health care provider about: ? Changing or stopping your regular medicines. This is especially important if you are taking diabetes medicines or blood thinners. ? Taking medicines such as aspirin and ibuprofen. These medicines can thin your blood. Do not take these medicines before your procedure if your health care provider instructs you not to.  Follow instructions from your health care provider about eating and drinking restrictions.  Plan to have someone take you home after the procedure.  If you go home right after the procedure, plan to have someone with you for 24 hours. What happens during the procedure?  You will be given one or more of the following: ? A medicine to help you relax (sedative). ? A medicine to numb the area (local anesthetic). ? A medicine to make you fall asleep (general anesthetic). ? A medicine that is injected into an area of your body to numb everything below the injection site (regional anesthetic).  An IV tube will be inserted into one of your veins. It will be  used to give you medicines and fluids during the procedure.  Your bladder may be emptied with a small tube (catheter).  If you have been given a general anesthetic, a tube will be put down your throat to help you breathe.  Two small cuts (incisions) will be made in your lower abdomen and near your belly button.  Your abdomen will be inflated with a gas. This will let the surgeon see better and will give the surgeon room to work.  A thin, lighted tube  (laparoscope) with a camera attached will be inserted into your abdomen through one of the incisions. Small instruments will be inserted through the other incision.  The fallopian tubes will be tied off, burned (cauterized), or blocked with a clip, ring, or clamp. A small portion in the center of each fallopian tube may be removed.  The gas will be released from the abdomen.  The incisions will be closed with stitches (sutures).  A bandage (dressing) will be placed over the incisions. The procedure may vary among health care providers and hospitals. What happens after the procedure?  Your blood pressure, heart rate, breathing rate, and blood oxygen level will be monitored often until the medicines you were given have worn off.  You will be given medicine to help with pain, nausea, and vomiting as needed. This information is not intended to replace advice given to you by your health care provider. Make sure you discuss any questions you have with your health care provider. Document Released: 10/27/2000 Document Revised: 12/27/2015 Document Reviewed: 07/01/2015 Elsevier Interactive Patient Education  2018 [employer].   Laparoscopic Tubal Ligation, Care After Refer to this sheet in the next few weeks. These instructions provide you with information about caring for yourself after your procedure. Your health care provider may also give you more specific instructions. Your treatment has been planned according to current medical practices, but problems sometimes occur. Call your health care provider if you have any problems or questions after your procedure. What can I expect after the procedure? After the procedure, it is common to have:  A sore throat.  Discomfort in your shoulder.  Mild discomfort or cramping in your abdomen.  Gas pains.  Pain or soreness in the area where the surgical cut (incision) was made.  A bloated feeling.  Tiredness.  Nausea.  Vomiting.  Follow  these instructions at home: Medicines  Take over-the-counter and prescription medicines only as told by your health care provider.  Do not take aspirin because it can cause bleeding.  Do not drive or operate heavy machinery while taking prescription pain medicine. Activity  Rest for the rest of the day.  Return to your normal activities as told by your health care provider. Ask your health care provider what activities are safe for you. Incision care   Follow instructions from your health care provider about how to take care of your incision. Make sure you: ? Wash your hands with soap and water before you change your bandage (dressing). If soap and water are not available, use hand sanitizer. ? Change your dressing as told by your health care provider. ? Leave stitches (sutures) in place. They may need to stay in place for 2 weeks or longer.  Check your incision area every day for signs of infection. Check for: ? More redness, swelling, or pain. ? More fluid or blood. ? Warmth. ? Pus or a bad smell. Other Instructions  Do not take baths, swim, or use a hot tub  until your health care provider approves. You may take showers.  Keep all follow-up visits as told by your health care provider. This is important.  Have someone help you with your daily household tasks for the first few days. Contact a health care provider if:  You have more redness, swelling, or pain around your incision.  Your incision feels warm to the touch.  You have pus or a bad smell coming from your incision.  The edges of your incision break open after the sutures have been removed.  Your pain does not improve after 2-3 days.  You have a rash.  You repeatedly become dizzy or light-headed.  Your pain medicine is not helping.  You are constipated. Get help right away if:  You have a fever.  You faint.  You have increasing pain in your abdomen.  You have severe pain in one or both of your  shoulders.  You have fluid or blood coming from your sutures or from your vagina.  You have shortness of breath or difficulty breathing.  You have chest pain or leg pain.  You have ongoing nausea, vomiting, or diarrhea. This information is not intended to replace advice given to you by your health care provider. Make sure you discuss any questions you have with your health care provider. Document Released: 02/07/2005 Document Revised: 12/24/2015 Document Reviewed: 07/01/2015 Elsevier Interactive Patient Education  2018 [employer]. General Anesthesia, Adult, Care After These instructions provide you with information about caring for yourself after your procedure. Your health care provider may also give you more specific instructions. Your treatment has been planned according to current medical practices, but problems sometimes occur. Call your health care provider if you have any problems or questions after your procedure. What can I expect after the procedure? After the procedure, it is common to have:  Vomiting.  A sore throat.  Mental slowness.  It is common to feel:  Nauseous.  Cold or shivery.  Sleepy.  Tired.  Sore or achy, even in parts of your body where you did not have surgery.  Follow these instructions at home: For at least 24 hours after the procedure:  Do not: ? Participate in activities where you could fall or become injured. ? Drive. ? Use heavy machinery. ? Drink alcohol. ? Take sleeping pills or medicines that cause drowsiness. ? Make important decisions or sign legal documents. ? Take care of children on your own.  Rest. Eating and drinking  If you vomit, drink water, juice, or soup when you can drink without vomiting.  Drink enough fluid to keep your urine clear or pale yellow.  Make sure you have little or no nausea before eating solid foods.  Follow the diet recommended by your health care provider. General instructions  Have a  responsible adult stay with you until you are awake and alert.  Return to your normal activities as told by your health care provider. Ask your health care provider what activities are safe for you.  Take over-the-counter and prescription medicines only as told by your health care provider.  If you smoke, do not smoke without supervision.  Keep all follow-up visits as told by your health care provider. This is important. Contact a health care provider if:  You continue to have nausea or vomiting at home, and medicines are not helpful.  You cannot drink fluids or start eating again.  You cannot urinate after 8-12 hours.  You develop a skin rash.  You have fever.  You have increasing redness at the site of your procedure. Get help right away if:  You have difficulty breathing.  You have chest pain.  You have unexpected bleeding.  You feel that you are having a life-threatening or urgent problem. This information is not intended to replace advice given to you by your health care provider. Make sure you discuss any questions you have with your health care provider. Document Released: 10/27/2000 Document Revised: 12/24/2015 Document Reviewed: 07/05/2015 Elsevier Interactive Patient Education  [employer].

## 2018-04-07 ENCOUNTER — Other Ambulatory Visit: Payer: Self-pay | Admitting: Obstetrics & Gynecology

## 2018-04-09 ENCOUNTER — Encounter (HOSPITAL_COMMUNITY)
Admission: RE | Admit: 2018-04-09 | Discharge: 2018-04-09 | Disposition: A | Discharge status: Home / Self Care | Payer: Self-pay | Source: Ambulatory Visit | Attending: Obstetrics & Gynecology | Admitting: Obstetrics & Gynecology

## 2018-04-09 DIAGNOSIS — Z01812 Encounter for preprocedural laboratory examination: Secondary | ICD-10-CM | POA: Insufficient documentation

## 2018-04-09 LAB — COMPREHENSIVE METABOLIC PANEL
ALBUMIN: 3.9 g/dL (ref 3.5–5.0)
ALT: 18 U/L (ref 0–44)
ANION GAP: 8 (ref 5–15)
AST: 18 U/L (ref 15–41)
Alkaline Phosphatase: 50 U/L (ref 38–126)
BUN: 8 mg/dL (ref 6–20)
CALCIUM: 8.8 mg/dL — AB (ref 8.9–10.3)
CO2: 23 mmol/L (ref 22–32)
Chloride: 108 mmol/L (ref 98–111)
Creatinine, Ser: 0.83 mg/dL (ref 0.44–1.00)
GFR calc non Af Amer: >60 mL/min (ref >60)
GLUCOSE: 91 mg/dL (ref 70–99)
POTASSIUM: 3.3 mmol/L — AB (ref 3.5–5.1)
SODIUM: 139 mmol/L (ref 135–145)
Total Bilirubin: 0.6 mg/dL (ref 0.3–1.2)
Total Protein: 7.1 g/dL (ref 6.5–8.1)

## 2018-04-09 LAB — URINALYSIS, ROUTINE W REFLEX MICROSCOPIC
Bilirubin Urine: NEGATIVE
GLUCOSE, UA: NEGATIVE mg/dL
Ketones, ur: NEGATIVE mg/dL
LEUKOCYTES UA: NEGATIVE
NITRITE: NEGATIVE
PROTEIN: NEGATIVE mg/dL
RBC / HPF: >50 RBC/hpf — ABNORMAL HIGH (ref 0–5)
SPECIFIC GRAVITY, URINE: 1.015 (ref 1.005–1.030)
pH: 6 (ref 5.0–8.0)

## 2018-04-09 LAB — RAPID HIV SCREEN (HIV 1/2 AB+AG)
HIV 1/2 ANTIBODIES: NONREACTIVE
HIV-1 P24 ANTIGEN - HIV24: NONREACTIVE

## 2018-04-09 LAB — CBC
HCT: 41 % (ref 36.0–46.0)
Hemoglobin: 14.1 g/dL (ref 12.0–15.0)
MCH: 30.6 pg (ref 26.0–34.0)
MCHC: 34.4 g/dL (ref 30.0–36.0)
MCV: 88.9 fL (ref 78.0–100.0)
PLATELETS: 287 10*3/uL (ref 150–400)
RBC: 4.61 MIL/uL (ref 3.87–5.11)
RDW: 12.5 % (ref 11.5–15.5)
WBC: 9.7 10*3/uL (ref 4.0–10.5)

## 2018-04-09 LAB — HCG, QUANTITATIVE, PREGNANCY: hCG, Beta Chain, Quant, S: <1 m[IU]/mL (ref <5)

## 2018-04-14 ENCOUNTER — Encounter (HOSPITAL_COMMUNITY)
Admission: RE | Disposition: A | Discharge status: Home / Self Care | Payer: Self-pay | Source: Ambulatory Visit | Attending: Obstetrics & Gynecology

## 2018-04-14 ENCOUNTER — Ambulatory Visit (HOSPITAL_COMMUNITY)
Admission: RE | Admit: 2018-04-14 | Discharge: 2018-04-14 | Disposition: A | Discharge status: Home / Self Care | Payer: Self-pay | Source: Ambulatory Visit | Attending: Obstetrics & Gynecology | Admitting: Obstetrics & Gynecology

## 2018-04-14 ENCOUNTER — Ambulatory Visit (HOSPITAL_COMMUNITY): Payer: Self-pay | Admitting: Anesthesiology

## 2018-04-14 ENCOUNTER — Encounter (HOSPITAL_COMMUNITY): Payer: Self-pay

## 2018-04-14 DIAGNOSIS — Z8489 Family history of other specified conditions: Secondary | ICD-10-CM | POA: Insufficient documentation

## 2018-04-14 DIAGNOSIS — Z8249 Family history of ischemic heart disease and other diseases of the circulatory system: Secondary | ICD-10-CM | POA: Insufficient documentation

## 2018-04-14 DIAGNOSIS — Z811 Family history of alcohol abuse and dependence: Secondary | ICD-10-CM | POA: Insufficient documentation

## 2018-04-14 DIAGNOSIS — Z810 Family history of intellectual disabilities: Secondary | ICD-10-CM | POA: Insufficient documentation

## 2018-04-14 DIAGNOSIS — F17290 Nicotine dependence, other tobacco product, uncomplicated: Secondary | ICD-10-CM | POA: Insufficient documentation

## 2018-04-14 DIAGNOSIS — Z302 Encounter for sterilization: Secondary | ICD-10-CM

## 2018-04-14 DIAGNOSIS — Z820 Family history of epilepsy and other diseases of the nervous system: Secondary | ICD-10-CM | POA: Insufficient documentation

## 2018-04-14 DIAGNOSIS — Z3049 Encounter for surveillance of other contraceptives: Secondary | ICD-10-CM

## 2018-04-14 DIAGNOSIS — D573 Sickle-cell trait: Secondary | ICD-10-CM | POA: Insufficient documentation

## 2018-04-14 DIAGNOSIS — F419 Anxiety disorder, unspecified: Secondary | ICD-10-CM | POA: Insufficient documentation

## 2018-04-14 HISTORY — PX: REMOVAL OF NON VAGINAL CONTRACEPTIVE DEVICE: SHX6219

## 2018-04-14 HISTORY — PX: LAPAROSCOPIC TUBAL LIGATION: SHX1937

## 2018-04-14 SURGERY — LIGATION, FALLOPIAN TUBE, LAPAROSCOPIC
Anesthesia: General | Site: Arm Upper | Laterality: Left

## 2018-04-14 MED ORDER — LIDOCAINE HCL (PF) 1 % IJ SOLN
INTRAMUSCULAR | Status: AC
  Filled 2018-04-14: qty 5

## 2018-04-14 MED ORDER — FENTANYL CITRATE (PF) 250 MCG/5ML IJ SOLN
INTRAMUSCULAR | Status: AC
  Filled 2018-04-14: qty 5

## 2018-04-14 MED ORDER — SUGAMMADEX SODIUM 200 MG/2ML IV SOLN
INTRAVENOUS | Status: DC | PRN
  Administered 2018-04-14: 200 mg via INTRAVENOUS

## 2018-04-14 MED ORDER — MIDAZOLAM HCL 5 MG/5ML IJ SOLN
INTRAMUSCULAR | Status: DC | PRN
  Administered 2018-04-14: 2 mg via INTRAVENOUS

## 2018-04-14 MED ORDER — ONDANSETRON HCL 4 MG/2ML IJ SOLN
INTRAMUSCULAR | Status: AC
  Filled 2018-04-14: qty 4

## 2018-04-14 MED ORDER — ROCURONIUM 10MG/ML (10ML) SYRINGE FOR MEDFUSION PUMP - OPTIME
INTRAVENOUS | Status: DC | PRN
  Administered 2018-04-14: 40 mg via INTRAVENOUS

## 2018-04-14 MED ORDER — SODIUM CHLORIDE 0.9 % IR SOLN
Status: DC | PRN
  Administered 2018-04-14: 1000 mL

## 2018-04-14 MED ORDER — KETOROLAC TROMETHAMINE 30 MG/ML IJ SOLN
30.0000 mg | Freq: Once | INTRAMUSCULAR | Status: AC
  Administered 2018-04-14: 30 mg via INTRAVENOUS
  Filled 2018-04-14: qty 1

## 2018-04-14 MED ORDER — MIDAZOLAM HCL 2 MG/2ML IJ SOLN
INTRAMUSCULAR | Status: AC
  Filled 2018-04-14: qty 2

## 2018-04-14 MED ORDER — KETOROLAC TROMETHAMINE 10 MG PO TABS: 10.0000 mg | ORAL_TABLET | Freq: Three times a day (TID) | ORAL | 0 refills | Status: DC | PRN

## 2018-04-14 MED ORDER — PROPOFOL 10 MG/ML IV BOLUS
INTRAVENOUS | Status: AC
  Filled 2018-04-14: qty 20

## 2018-04-14 MED ORDER — ONDANSETRON 8 MG PO TBDP: 8.0000 mg | ORAL_TABLET | Freq: Three times a day (TID) | ORAL | 0 refills | Status: DC | PRN

## 2018-04-14 MED ORDER — LACTATED RINGERS IV SOLN
INTRAVENOUS | Status: DC | PRN
  Administered 2018-04-14: 12:00:00 via INTRAVENOUS

## 2018-04-14 MED ORDER — FENTANYL CITRATE (PF) 100 MCG/2ML IJ SOLN
INTRAMUSCULAR | Status: DC | PRN
  Administered 2018-04-14 (×3): 50 ug via INTRAVENOUS

## 2018-04-14 MED ORDER — HYDROCODONE-ACETAMINOPHEN 5-325 MG PO TABS: 1.0000 | ORAL_TABLET | Freq: Four times a day (QID) | ORAL | 0 refills | Status: DC | PRN

## 2018-04-14 MED ORDER — PROPOFOL 10 MG/ML IV BOLUS
INTRAVENOUS | Status: DC | PRN
  Administered 2018-04-14: 175 mg via INTRAVENOUS

## 2018-04-14 MED ORDER — ONDANSETRON HCL 4 MG/2ML IJ SOLN
INTRAMUSCULAR | Status: DC | PRN
  Administered 2018-04-14: 4 mg via INTRAVENOUS

## 2018-04-14 MED ORDER — CEFAZOLIN SODIUM-DEXTROSE 2-4 GM/100ML-% IV SOLN
2.0000 g | INTRAVENOUS | Status: AC
  Administered 2018-04-14: 2 g via INTRAVENOUS
  Filled 2018-04-14: qty 100

## 2018-04-14 MED ORDER — LIDOCAINE HCL (CARDIAC) PF 50 MG/5ML IV SOSY
PREFILLED_SYRINGE | INTRAVENOUS | Status: DC | PRN
  Administered 2018-04-14: 40 mg via INTRAVENOUS

## 2018-04-14 MED ORDER — ROCURONIUM BROMIDE 50 MG/5ML IV SOLN
INTRAVENOUS | Status: AC
  Filled 2018-04-14: qty 1

## 2018-04-14 MED ORDER — GLYCOPYRROLATE 0.2 MG/ML IJ SOLN
INTRAMUSCULAR | Status: DC | PRN
  Administered 2018-04-14: 0.2 mg via INTRAVENOUS

## 2018-04-14 SURGICAL SUPPLY — 35 items
BLADE SURG SZ11 CARB STEEL (BLADE) ×4 IMPLANT
CLOTH BEACON ORANGE TIMEOUT ST (SAFETY) ×4 IMPLANT
COVER LIGHT HANDLE STERIS (MISCELLANEOUS) ×8 IMPLANT
DERMABOND ADVANCED (GAUZE/BANDAGES/DRESSINGS) ×2
DERMABOND ADVANCED .7 DNX12 (GAUZE/BANDAGES/DRESSINGS) ×2 IMPLANT
ELECT REM PT RETURN 9FT ADLT (ELECTROSURGICAL) ×4
ELECTRODE REM PT RTRN 9FT ADLT (ELECTROSURGICAL) ×2 IMPLANT
GAUZE SPONGE 4X4 16PLY XRAY LF (GAUZE/BANDAGES/DRESSINGS) ×4 IMPLANT
GLOVE BIOGEL M 7.0 STRL (GLOVE) ×4 IMPLANT
GLOVE BIOGEL PI IND STRL 7.0 (GLOVE) ×8 IMPLANT
GLOVE BIOGEL PI IND STRL 8 (GLOVE) ×2 IMPLANT
GLOVE BIOGEL PI INDICATOR 7.0 (GLOVE) ×8
GLOVE BIOGEL PI INDICATOR 8 (GLOVE) ×2
GLOVE ECLIPSE 6.5 STRL STRAW (GLOVE) ×4 IMPLANT
GLOVE ECLIPSE 8.0 STRL XLNG CF (GLOVE) ×4 IMPLANT
GOWN STRL REUS W/TWL LRG LVL3 (GOWN DISPOSABLE) ×4 IMPLANT
GOWN STRL REUS W/TWL XL LVL3 (GOWN DISPOSABLE) ×4 IMPLANT
INST SET LAPROSCOPIC GYN AP (KITS) ×4 IMPLANT
KIT TURNOVER CYSTO (KITS) ×4 IMPLANT
MANIFOLD NEPTUNE II (INSTRUMENTS) ×4 IMPLANT
NEEDLE INSUFFLATION 14GA 120MM (NEEDLE) ×4 IMPLANT
NS IRRIG 1000ML POUR BTL (IV SOLUTION) ×4 IMPLANT
PACK PERI GYN (CUSTOM PROCEDURE TRAY) ×4 IMPLANT
PAD ARMBOARD 7.5X6 YLW CONV (MISCELLANEOUS) ×4 IMPLANT
SET BASIN LINEN APH (SET/KITS/TRAYS/PACK) ×4 IMPLANT
SOLUTION ANTI FOG 6CC (MISCELLANEOUS) ×4 IMPLANT
SPONGE GAUZE 2X2 8PLY STER LF (GAUZE/BANDAGES/DRESSINGS) ×2
SPONGE GAUZE 2X2 8PLY STRL LF (GAUZE/BANDAGES/DRESSINGS) ×6 IMPLANT
STAPLER VISISTAT 35W (STAPLE) ×4 IMPLANT
SUT VICRYL 0 UR6 27IN ABS (SUTURE) ×4 IMPLANT
SYR 10ML LL (SYRINGE) ×4 IMPLANT
TAPE CLOTH SURG 4X10 WHT LF (GAUZE/BANDAGES/DRESSINGS) ×8 IMPLANT
TROCAR ENDO BLADELESS 11MM (ENDOMECHANICALS) ×4 IMPLANT
TUBING INSUF HEATED (TUBING) ×4 IMPLANT
WARMER LAPAROSCOPE (MISCELLANEOUS) ×4 IMPLANT

## 2018-04-14 NOTE — Interval H&P Note (Signed)
History and Physical Interval Note:  04/14/2018 12:24 PM  Julia Castro  has presented today for surgery, with the diagnosis of sterilization  The various methods of treatment have been discussed with the patient and family. After consideration of risks, benefits and other options for treatment, the patient has consented to  Procedure(s): LAPAROSCOPIC TUBAL LIGATION (Bilateral) REMOVAL OF NON VAGINAL CONTRACEPTIVE DEVICE (NEXPLANON) (N/A) as a surgical intervention .  The patient's history has been reviewed, patient examined, no change in status, stable for surgery.  I have reviewed the patient's chart and labs.  Questions were answered to the patient's satisfaction.     Lazaro Arms

## 2018-04-14 NOTE — Anesthesia Preprocedure Evaluation (Signed)
Anesthesia Evaluation  Patient identified by MRN, date of birth, ID band Patient awake    Reviewed: Allergy & Precautions, NPO status , Patient's Chart, lab work & pertinent test results  Airway Mallampati: I  TM Distance: >3 FB Neck ROM: Full    Dental no notable dental hx. (+) Teeth Intact   Pulmonary neg pulmonary ROS, Current Smoker,  States slight sore throat -denies fever or productive cough  D/w pt that elective operation and if not feeling up to it today we should wait -WTP with planned L/S BTL   Pulmonary exam normal breath sounds clear to auscultation       Cardiovascular Exercise Tolerance: Good negative cardio ROS Normal cardiovascular examI Rhythm:Regular Rate:Normal  States has a murmer -denies any limitations  Good ET   Neuro/Psych Anxiety H/o Panic attacks -states last was ~6-9 months prior - denies hospitalizations for panicnegative neurological ROS  negative psych ROS   GI/Hepatic negative GI ROS, Neg liver ROS,   Endo/Other  negative endocrine ROS  Renal/GU negative Renal ROS  negative genitourinary   Musculoskeletal negative musculoskeletal ROS (+)   Abdominal   Peds negative pediatric ROS (+)  Hematology negative hematology ROS (+)   Anesthesia Other Findings   Reproductive/Obstetrics negative OB ROS                             Anesthesia Physical Anesthesia Plan  ASA: II  Anesthesia Plan: General   Post-op Pain Management:    Induction: Intravenous  PONV Risk Score and Plan:   Airway Management Planned: Oral ETT  Additional Equipment:   Intra-op Plan:   Post-operative Plan: Extubation in OR  Informed Consent: I have reviewed the patients History and Physical, chart, labs and discussed the procedure including the risks, benefits and alternatives for the proposed anesthesia with the patient or authorized representative who has indicated his/her  understanding and acceptance.   Dental advisory given  Plan Discussed with: CRNA  Anesthesia Plan Comments:         Anesthesia Quick Evaluation

## 2018-04-14 NOTE — Op Note (Signed)
Preoperative Diagnosis:  Multiparous female desires permanent sterilization  Postoperative Diagnosis:  Same as above  Procedure:               > Laparoscopic Bilateral Tubal Ligation using electrocautery(pt declined                                      bilateral salpingectomy for ovarian cancer prophylaxis)                                   >Removal of Nexplanon  Surgeon:  Rockne Coons MD  Anaesthesia:  LMA  Findings:  Patient had normal pelvic anatomy and no intraperitoneal abnormalities.  Description of Operation:  Patient was taken to the OR and placed into supine position where she underwent LMA anaesthesia.  She was placed in the dorsal lithotomy position and prepped and draped in the usual sterile fashion.  An incision was made in the umbilicus and dissection taken down to the rectus fascia which was incised and opened.  The non bladed trocar was then placed and the peritoneal cavity was insufflated.  The above noted findings were observed.  The Kleppinger electrocautery unit was employed and both tubes were burned to no resistance and beyond in the distal ishtmic and ampullary regions, approximately a 3.5 cm segment bilaterally.  There was good hemostasis.  The fascia, peritoneum and subcutaneous tissue were closed using 0 vicryl.  The skin was closed using staples.   Dressing was placed  Left upper arm was then prepped with betadine 2 stab incisions made to remove nexplanon, the rod was not in the site that the patient had pointed out, it was in the higher insertion site It was removed without difficulty dermabond was placed and the 2 sites were bandages    The patient was awakened from anaesthesia and taken to the PACU with all counts being correct x 3.  The patient received Ancef 1 gram and Toradol 30 mg IV preoperatively.  Lazaro Arms, MD 04/14/2018 1:26 PM

## 2018-04-14 NOTE — Anesthesia Postprocedure Evaluation (Signed)
Anesthesia Post Note  Patient: Julia Castro  Procedure(s) Performed: LAPAROSCOPIC BILATERAL TUBAL LIGATION USING ELECTROCAUTERY (Bilateral ) REMOVAL OF NON VAGINAL CONTRACEPTIVE DEVICE (NEXPLANON) (Left Arm Upper)  Patient location during evaluation: PACU Anesthesia Type: General Level of consciousness: awake and alert and oriented Pain management: pain level controlled Vital Signs Assessment: post-procedure vital signs reviewed and stable Respiratory status: spontaneous breathing Cardiovascular status: blood pressure returned to baseline and stable Postop Assessment: no apparent nausea or vomiting Anesthetic complications: no     Last Vitals:  Vitals:   04/14/18 1345 04/14/18 1400  BP: 117/81 118/63  Pulse: 63 (!) 52  Resp: 16 (!) 21  Temp:    SpO2: 96% 100%    Last Pain:  Vitals:   04/14/18 1335  TempSrc:   PainSc: 0-No pain                 Alegra Rost

## 2018-04-14 NOTE — Anesthesia Procedure Notes (Signed)
Procedure Name: Intubation Date/Time: 04/14/2018 12:39 PM Performed by: Ollen Bowl, CRNA Pre-anesthesia Checklist: Patient identified, Patient being monitored, Timeout performed, Emergency Drugs available and Suction available Patient Re-evaluated:Patient Re-evaluated prior to induction Oxygen Delivery Method: Circle system utilized Preoxygenation: Pre-oxygenation with 100% oxygen Induction Type: IV induction Ventilation: Mask ventilation without difficulty Laryngoscope Size: Mac and 3 Grade View: Grade I Tube type: Oral Tube size: 7.0 mm Number of attempts: 1 Airway Equipment and Method: Stylet Placement Confirmation: ETT inserted through vocal cords under direct vision,  positive ETCO2 and breath sounds checked- equal and bilateral Secured at: 21 cm Tube secured with: Tape Dental Injury: Teeth and Oropharynx as per pre-operative assessment

## 2018-04-14 NOTE — Transfer of Care (Signed)
Immediate Anesthesia Transfer of Care Note  Patient: Raneisha L Cartelli  Procedure(s) Performed: LAPAROSCOPIC BILATERAL TUBAL LIGATION USING ELECTROCAUTERY (Bilateral ) REMOVAL OF NON VAGINAL CONTRACEPTIVE DEVICE (NEXPLANON) (N/A )  Patient Location: PACU  Anesthesia Type:General  Level of Consciousness: awake and alert   Airway & Oxygen Therapy: Patient Spontanous Breathing  Post-op Assessment: Report given to RN  Post vital signs: Reviewed and stable  Last Vitals:  Vitals Value Taken Time  BP 128/70 04/14/2018  1:32 PM  Temp    Pulse 69 04/14/2018  1:35 PM  Resp 17 04/14/2018  1:35 PM  SpO2 97 % 04/14/2018  1:35 PM  Vitals shown include unvalidated device data.  Last Pain:  Vitals:   04/14/18 1011  TempSrc: Oral  PainSc: 0-No pain         Complications: No apparent anesthesia complications

## 2018-04-14 NOTE — H&P (Signed)
Preoperative History and Physical  Julia Castro is a 27 y.o. [id number] with Patient's last menstrual period was 02/28/2018. admitted for a laparoscopic bilateral tubal ligation using electrocautery and removal of nexplanon.  [id number]   PMH:        Past Medical History:  Diagnosis Date  . Chlamydia   . Heart murmur    Benign  . Panic attacks     PSH:          Past Surgical History:  Procedure Laterality Date  . INDUCED ABORTION  09/2013  . WISDOM TOOTH EXTRACTION      POb/GynH:              OB History    Gravida  4   Para  3   Term  3   Preterm  0   AB  1   Living  3     SAB  0   TAB  0   Ectopic  0   Multiple  0   Live Births  3           SH:   Social History        Tobacco Use  . Smoking status: Former Smoker    Years: 3.00    Types: Cigars  . Smokeless tobacco: Never Used  . Tobacco comment: smokes 1 daily  Substance Use Topics  . Alcohol use: Yes    Alcohol/week: 1.0 standard drinks    Types: 1 Glasses of wine per week  . Drug use: No    FH:         Family History  Problem Relation Age of Onset  . Other Mother        heart issues  . Seizures Father   . Cancer Sister   . Mental retardation Brother   . Mental illness Maternal Grandmother   . Anxiety disorder Maternal Grandmother   . Alcohol abuse Maternal Grandfather        in the past  . Anxiety disorder Paternal Grandmother   . Other Brother        MVA  . SIDS Sister   . Anesthesia problems Neg Hx      Allergies: No Known Allergies  Medications:       Current Outpatient Medications:  .  traMADol (ULTRAM) 50 MG tablet, Take 1 tablet (50 mg total) by mouth every 6 (six) hours as needed. (Patient not taking: Reported on 03/02/2018), Disp: 15 tablet, Rfl: 0  Review of Systems:   Review of Systems  Constitutional: Negative for fever, chills, weight loss, malaise/fatigue and diaphoresis.  HENT: Negative for hearing  loss, ear pain, nosebleeds, congestion, sore throat, neck pain, tinnitus and ear discharge.   Eyes: Negative for blurred vision, double vision, photophobia, pain, discharge and redness.  Respiratory: Negative for cough, hemoptysis, sputum production, shortness of breath, wheezing and stridor.   Cardiovascular: Negative for chest pain, palpitations, orthopnea, claudication, leg swelling and PND.  Gastrointestinal: Positive for abdominal pain. Negative for heartburn, nausea, vomiting, diarrhea, constipation, blood in stool and melena.  Genitourinary: Negative for dysuria, urgency, frequency, hematuria and flank pain.  Musculoskeletal: Negative for myalgias, back pain, joint pain and falls.  Skin: Negative for itching and rash.  Neurological: Negative for dizziness, tingling, tremors, sensory change, speech change, focal weakness, seizures, loss of consciousness, weakness and headaches.  Endo/Heme/Allergies: Negative for environmental allergies and polydipsia. Does not bruise/bleed easily.  Psychiatric/Behavioral: Negative for depression, suicidal ideas, hallucinations, memory loss and substance abuse. The patient  is not nervous/anxious and does not have insomnia.      PHYSICAL EXAM:  Blood pressure 123/80, pulse 60, height 5\' 1"  (1.549 m), weight 184 lb (83.5 kg), last menstrual period 02/28/2018, not currently breastfeeding.    Vitals reviewed. Constitutional: She is oriented to person, place, and time. She appears well-developed and well-nourished.  HENT:  Head: Normocephalic and atraumatic.  Right Ear: External ear normal.  Left Ear: External ear normal.  Nose: Nose normal.  Mouth/Throat: Oropharynx is clear and moist.  Eyes: Conjunctivae and EOM are normal. Pupils are equal, round, and reactive to light. Right eye exhibits no discharge. Left eye exhibits no discharge. No scleral icterus.  Neck: Normal range of motion. Neck supple. No tracheal deviation present. No thyromegaly  present.  Cardiovascular: Normal rate, regular rhythm, normal heart sounds and intact distal pulses.  Exam reveals no gallop and no friction rub.   No murmur heard. Respiratory: Effort normal and breath sounds normal. No respiratory distress. She has no wheezes. She has no rales. She exhibits no tenderness.  GI: Soft. Bowel sounds are normal. She exhibits no distension and no mass. There is tenderness. There is no rebound and no guarding.  Genitourinary:       Vulva is normal without lesions Vagina is pink moist without discharge Cervix normal in appearance and pap is normal Uterus is normal size, contour, position, consistency, mobility, non-tender Adnexa is negative with normal sized ovaries by sonogram  Musculoskeletal: Normal range of motion. She exhibits no edema and no tenderness.  Neurological: She is alert and oriented to person, place, and time. She has normal reflexes. She displays normal reflexes. No cranial nerve deficit. She exhibits normal muscle tone. Coordination normal.  Skin: Skin is warm and dry. No rash noted. No erythema. No pallor.  Psychiatric: She has a normal mood and affect. Her behavior is normal. Judgment and thought content normal.    Labs: No results found for this or any previous visit (from the past 336 hour(s)).  EKG:    Orders placed or performed during the hospital encounter of 02/23/16  . EKG 12-Lead  . EKG 12-Lead  . EKG    Imaging Studies: Imaging Results  No results found.      Assessment: Desires permanent steriization and removal of nexplanon       Patient Active Problem List   Diagnosis Date Noted  . Nexplanon insertion 05/08/2016  . Pica 01/09/2016  . Sickle cell trait (HCC) 10/01/2015  . Smoker 08/29/2015  . Anxiety w/ panic attacks 08/29/2015    Plan: Laparoscopic bilateral tubal ligation using electrocautery, removal of nexplanon 04/14/2018  Julia Castro 03/24/2018 9:15 PM

## 2018-04-16 ENCOUNTER — Encounter (HOSPITAL_COMMUNITY): Payer: Self-pay | Admitting: Obstetrics & Gynecology

## 2018-04-26 ENCOUNTER — Ambulatory Visit (INDEPENDENT_AMBULATORY_CARE_PROVIDER_SITE_OTHER): Payer: Self-pay | Admitting: Obstetrics & Gynecology

## 2018-04-26 ENCOUNTER — Other Ambulatory Visit: Payer: Self-pay

## 2018-04-26 ENCOUNTER — Encounter (INDEPENDENT_AMBULATORY_CARE_PROVIDER_SITE_OTHER): Payer: Self-pay

## 2018-04-26 ENCOUNTER — Encounter: Payer: Self-pay | Admitting: Obstetrics & Gynecology

## 2018-04-26 VITALS — BP 122/84 | HR 75 | Ht 61.0 in | Wt 185.0 lb

## 2018-04-26 DIAGNOSIS — Z9889 Other specified postprocedural states: Secondary | ICD-10-CM

## 2018-04-26 NOTE — Progress Notes (Signed)
  HPI: Patient returns for routine postoperative follow-up having undergone laparoscopic tubal ligation using electrocautery on 04/14/2018.  The patient's immediate postoperative recovery has been unremarkable. Since hospital discharge the patient reports no problems.   Current Outpatient Medications: HYDROcodone-acetaminophen (NORCO/VICODIN) 5-325 MG tablet, Take 1 tablet by mouth every 6 (six) hours as needed., Disp: 10 tablet, Rfl: 0 ketorolac (TORADOL) 10 MG tablet, Take 1 tablet (10 mg total) by mouth every 8 (eight) hours as needed. (Patient not taking: Reported on 04/26/2018), Disp: 15 tablet, Rfl: 0 ondansetron (ZOFRAN ODT) 8 MG disintegrating tablet, Take 1 tablet (8 mg total) by mouth every 8 (eight) hours as needed for nausea or vomiting. (Patient not taking: Reported on 04/26/2018), Disp: 20 tablet, Rfl: 0  No current facility-administered medications for this visit.     Blood pressure 122/84, pulse 75, height 5\' 1"  (1.549 m), weight 185 lb (83.9 kg).  Physical Exam: Incision clean dry intact Staples removed  Diagnostic Tests:   Pathology: n/a  Impression: S/p laparoscopic bilateral tubal ligation  Plan:   Follow up: Prn or yearly    Lazaro ArmsLuther H Lukisha Procida, MD

## 2018-09-29 IMAGING — DX DG TOE 3RD 2+V*L*
3 series · 3 of 3 positions shown · non-contrast
Comparison: None.

CLINICAL DATA: Blunt trauma third toe.  Laceration.

EXAM:
LEFT THIRD TOE

[toe ap]
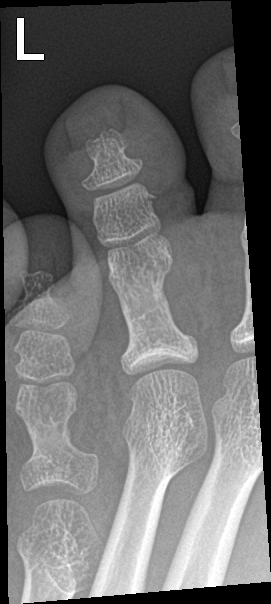

[toe obl]
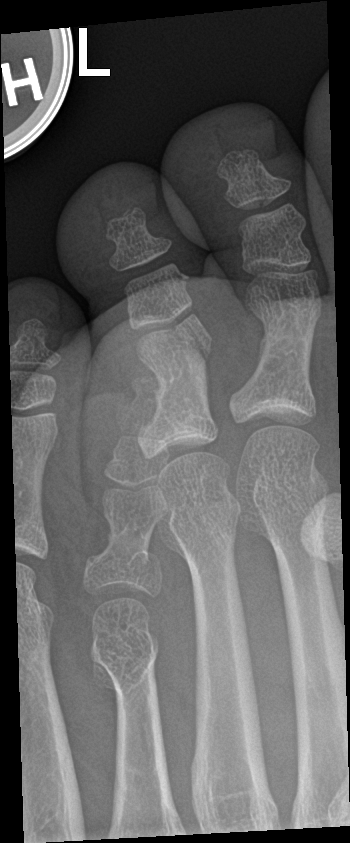

[toe lat]
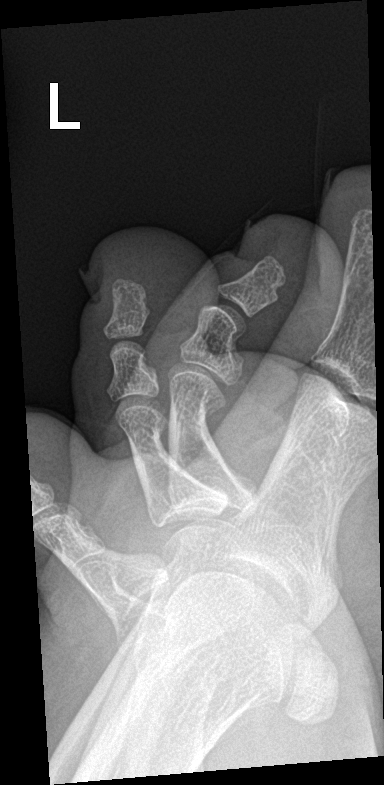

[3 of 3 positions shown; findings below may reference images not displayed]

FINDINGS: Acute nondisplaced fracture fragment from medial aspect third middle
phalanx. Additional linear lucency through distal aspects third
proximal phalanx though this appears to extend past the cortex and
may be artifact. No dislocation. No destructive bony lesions. Soft
tissue planes are nonsuspicious.
IMPRESSION: Acute nondisplaced third middle phalanx fracture. Nondisplaced third
proximal phalanx fracture versus artifact. No dislocation.

## 2019-08-15 ENCOUNTER — Other Ambulatory Visit (INDEPENDENT_AMBULATORY_CARE_PROVIDER_SITE_OTHER): Payer: Self-pay | Admitting: *Deleted

## 2019-08-15 ENCOUNTER — Other Ambulatory Visit (HOSPITAL_COMMUNITY)
Admission: RE | Admit: 2019-08-15 | Discharge: 2019-08-15 | Disposition: A | Discharge status: Home / Self Care | Payer: Self-pay | Source: Ambulatory Visit | Attending: Obstetrics & Gynecology | Admitting: Obstetrics & Gynecology

## 2019-08-15 ENCOUNTER — Other Ambulatory Visit: Payer: Self-pay

## 2019-08-15 DIAGNOSIS — Z113 Encounter for screening for infections with a predominantly sexual mode of transmission: Secondary | ICD-10-CM | POA: Insufficient documentation

## 2019-08-15 NOTE — Progress Notes (Addendum)
   NURSE VISIT- VAGINITIS/STD/POC  SUBJECTIVE:  Julia Castro is a 29 y.o. [id number] GYN patientfemale here for a vaginal swab for STD screen.  She reports the following symptoms: none. Denies abnormal vaginal bleeding, significant pelvic pain, fever, or UTI symptoms.  OBJECTIVE:  There were no vitals taken for this visit.  Appears well, in no apparent distress  ASSESSMENT: Vaginal swab for STD screen  PLAN: Self-collected vaginal probe for Gonorrhea, Chlamydia, Trichomonas sent to lab Treatment: to be determined once results are received Follow-up as needed if symptoms persist/worsen, or new symptoms develop  Stoney Bang  08/15/2019 10:02 AM   Chart reviewed for nurse visit. Agree with plan of care.  Jacklyn Shell, CNM 08/15/2019 10:21 AM

## 2019-08-16 LAB — CERVICOVAGINAL ANCILLARY ONLY
Chlamydia: NEGATIVE
Comment: NEGATIVE
Comment: NEGATIVE
Comment: NORMAL
Neisseria Gonorrhea: NEGATIVE
Trichomonas: NEGATIVE

## 2019-12-02 ENCOUNTER — Ambulatory Visit: Payer: Self-pay

## 2019-12-02 ENCOUNTER — Ambulatory Visit: Payer: Self-pay | Attending: Internal Medicine

## 2019-12-02 ENCOUNTER — Other Ambulatory Visit: Payer: Self-pay

## 2019-12-02 DIAGNOSIS — U071 COVID-19: Secondary | ICD-10-CM | POA: Insufficient documentation

## 2019-12-02 DIAGNOSIS — Z20822 Contact with and (suspected) exposure to covid-19: Secondary | ICD-10-CM

## 2019-12-04 LAB — SARS-COV-2, NAA 2 DAY TAT

## 2019-12-04 LAB — NOVEL CORONAVIRUS, NAA: SARS-CoV-2, NAA: DETECTED — AB

## 2020-12-07 ENCOUNTER — Other Ambulatory Visit: Payer: Self-pay

## 2020-12-07 ENCOUNTER — Ambulatory Visit (INDEPENDENT_AMBULATORY_CARE_PROVIDER_SITE_OTHER): Payer: Self-pay | Admitting: Adult Health

## 2020-12-07 ENCOUNTER — Encounter: Payer: Self-pay | Admitting: Adult Health

## 2020-12-07 VITALS — BP 132/82 | HR 71 | Ht 61.0 in | Wt 196.5 lb

## 2020-12-07 DIAGNOSIS — N898 Other specified noninflammatory disorders of vagina: Secondary | ICD-10-CM

## 2020-12-07 DIAGNOSIS — B3731 Acute candidiasis of vulva and vagina: Secondary | ICD-10-CM

## 2020-12-07 DIAGNOSIS — B373 Candidiasis of vulva and vagina: Secondary | ICD-10-CM

## 2020-12-07 LAB — POCT WET PREP (WET MOUNT)

## 2020-12-07 MED ORDER — FLUCONAZOLE 150 MG PO TABS: ORAL_TABLET | ORAL | 1 refills | Status: DC

## 2020-12-07 NOTE — Progress Notes (Signed)
  Subjective:     Patient ID: Julia Castro, female   DOB: 1991-06-20, 30 y.o.   MRN: [id number]  HPI Julia Castro is a 30 year old black female,single, [id number], in complaining of vaginal discharge for 2 weeks. She is self pay. Last pap was normal 08/13/2017.  Review of Systems Has vaginal discharge for about 2 weeks Denies any odor, burning or itching did use monistat for 2 days Not having sex Some weight gain Reviewed past medical,surgical, social and family history. Reviewed medications and allergies.     Objective:   Physical Exam BP 132/82 (BP Location: Left Arm, Patient Position: Sitting, Cuff Size: Normal)   Pulse 71   Ht 5\' 1"  (1.549 m)   Wt 196 lb 8 oz (89.1 kg)   LMP 11/14/2020 (Approximate)   BMI 37.13 kg/m  Skin warm and dry.Pelvic: external genitalia is normal in appearance no lesions, vagina: white to yellowish discharge without odor,urethra has no lesions or masses noted, cervix:smooth and bulbous, uterus: normal size, shape and contour, non tender, no masses felt, adnexa: no masses or tenderness noted. Bladder is non tender and no masses felt. Wet prep: + for yeast  Examination chaperoned by 11/16/2020 LPN    Fall risk is low  [facility] - 12/07/20 1028      Pregnancy Intention Screening   Does the patient want to become pregnant in the next year? No    Does the patient's partner want to become pregnant in the next year? No    Would the patient like to discuss contraceptive options today? No      Contraception Wrap Up   Current Method Female Sterilization    End Method Female Sterilization    Contraception Counseling Provided No          Assessment:       1. Vaginal discharge Will Rx diflucan   2. Yeast vaginitis Rx diflucan Meds ordered this encounter  Medications  . fluconazole (DIFLUCAN) 150 MG tablet    Sig: Take 1 now and 1 in 3 days    Dispense:  2 tablet    Refill:  1    Order Specific Question:   Supervising Provider    Answer:   02/06/21  [[id number]]      Plan:    Not ready to stop smoking  Return in about 6 weeks for pap and physical, to apply for CFA.

## 2020-12-17 ENCOUNTER — Other Ambulatory Visit: Payer: Self-pay

## 2020-12-17 ENCOUNTER — Ambulatory Visit
Admission: EM | Admit: 2020-12-17 | Discharge: 2020-12-17 | Disposition: A | Discharge status: Home / Self Care | Payer: Self-pay | Attending: Emergency Medicine | Admitting: Emergency Medicine

## 2020-12-17 DIAGNOSIS — K047 Periapical abscess without sinus: Secondary | ICD-10-CM

## 2020-12-17 MED ORDER — AMOXICILLIN-POT CLAVULANATE 875-125 MG PO TABS: 1.0000 | ORAL_TABLET | Freq: Two times a day (BID) | ORAL | 0 refills | Status: DC

## 2020-12-17 NOTE — ED Triage Notes (Signed)
Provider triage  

## 2020-12-17 NOTE — ED Provider Notes (Signed)
[facility]    CSN: [id number] Arrival date & time: 12/17/20  0815      History   Chief Complaint No chief complaint on file.   HPI Julia Castro is a 30 y.o. female.   Julia Castro presents with complaints of  Left lower jaw pain and swelling which started around 3 days ago after eating crab legs. No specific tooth pain or injury. No fevers. Pain is not worse with eating. No previous similar. No fevers.    ROS per HPI, negative if not otherwise mentioned.      Past Medical History:  Diagnosis Date  . Chlamydia   . Heart murmur    Benign  . Panic attacks     Patient Active Problem List   Diagnosis Date Noted  . Yeast vaginitis 12/07/2020  . Vaginal discharge 12/07/2020  . Nexplanon insertion 05/08/2016  . Pica 01/09/2016  . Sickle cell trait (HCC) 10/01/2015  . Smoker 08/29/2015  . Anxiety w/ panic attacks 08/29/2015    Past Surgical History:  Procedure Laterality Date  . INDUCED ABORTION  09/2013  . LAPAROSCOPIC TUBAL LIGATION Bilateral 04/14/2018   Procedure: LAPAROSCOPIC BILATERAL TUBAL LIGATION USING ELECTROCAUTERY;  Surgeon: Lazaro Arms, MD;  Location: [facility];  Service: Gynecology;  Laterality: Bilateral;  . REMOVAL OF NON VAGINAL CONTRACEPTIVE DEVICE Left 04/14/2018   Procedure: REMOVAL OF NON VAGINAL CONTRACEPTIVE DEVICE (NEXPLANON);  Surgeon: Lazaro Arms, MD;  Location: [facility];  Service: Gynecology;  Laterality: Left;  . WISDOM TOOTH EXTRACTION      OB History    Gravida  4   Para  3   Term  3   Preterm  0   AB  1   Living  3     SAB  0   IAB  0   Ectopic  0   Multiple  0   Live Births  3            Home Medications    Prior to Admission medications   Medication Sig Start Date End Date Taking? Authorizing Provider  amoxicillin-clavulanate (AUGMENTIN) 875-125 MG tablet Take 1 tablet by mouth every 12 (twelve) hours. 12/17/20  Yes Linus Mako B, NP  fluconazole (DIFLUCAN) 150 MG tablet Take 1 now and  1 in 3 days 12/07/20   Adline Potter, NP    Family History Family History  Problem Relation Age of Onset  . Other Mother        heart issues  . Seizures Father   . Cancer Sister   . Mental retardation Brother   . Mental illness Maternal Grandmother   . Anxiety disorder Maternal Grandmother   . Alcohol abuse Maternal Grandfather        in the past  . Anxiety disorder Paternal Grandmother   . Other Brother        MVA  . SIDS Sister   . Anesthesia problems Neg Hx     Social History Social History   Tobacco Use  . Smoking status: Current Every Day Smoker    Years: 3.00    Types: Cigarettes  . Smokeless tobacco: Never Used  . Tobacco comment: smokes 1 daily  Vaping Use  . Vaping Use: Never used  Substance Use Topics  . Alcohol use: Yes    Alcohol/week: 1.0 standard drink    Types: 1 Glasses of wine per week  . Drug use: No     Allergies   Patient has no known  allergies.   Review of Systems Review of Systems   Physical Exam Triage Vital Signs ED Triage Vitals [12/17/20 0839]  Enc Vitals Group     BP (!) 149/96     Pulse Rate 68     Resp 18     Temp 98.1 F (36.7 C)     Temp Source Oral     SpO2 98 %     Weight      Height      Head Circumference      Peak Flow      Pain Score      Pain Loc      Pain Edu?      Excl. in GC?    No data found.  Updated Vital Signs BP (!) 149/96   Pulse 68   Temp 98.1 F (36.7 C) (Oral)   Resp 18   SpO2 98%   Visual Acuity Right Eye Distance:   Left Eye Distance:   Bilateral Distance:    Right Eye Near:   Left Eye Near:    Bilateral Near:     Physical Exam Constitutional:      General: She is not in acute distress.    Appearance: She is well-developed.  HENT:     Head:     Jaw: Tenderness present.      Comments: Point tenderness with swelling to left lower jaw; no obvious dental abnormality on exam or visible abscess to gumline  Cardiovascular:     Rate and Rhythm: Normal rate.  Pulmonary:      Effort: Pulmonary effort is normal.  Skin:    General: Skin is warm and dry.  Neurological:     Mental Status: She is alert and oriented to person, place, and time.      UC Treatments / Results  Labs (all labs ordered are listed, but only abnormal results are displayed) Labs Reviewed - No data to display  EKG   Radiology No results found.  Procedures Procedures (including critical care time)  Medications Ordered in UC Medications - No data to display  Initial Impression / Assessment and Plan / UC Course  I have reviewed the triage vital signs and the nursing notes.  Pertinent labs & imaging results that were available during my care of the patient were reviewed by me and considered in my medical decision making (see chart for details).     Appears consistent with dental abscess with antibiotics provided today. Encouraged dental follow up. Return precautions provided. Patient verbalized understanding and agreeable to plan.   Final Clinical Impressions(s) / UC Diagnoses   Final diagnoses:  Dental abscess     Discharge Instructions     Warm compresses.  Ibuprofen 800mg  every 8 hours as needed for pain.  Complete course of antibiotics.  Follow up with your dentist for definitive treatment    ED Prescriptions    Medication Sig Dispense Auth. Provider   amoxicillin-clavulanate (AUGMENTIN) 875-125 MG tablet Take 1 tablet by mouth every 12 (twelve) hours. 14 tablet , NP     PDMP not reviewed this encounter.   Georgetta Haber, NP 12/17/20 1521

## 2020-12-17 NOTE — Discharge Instructions (Signed)
Warm compresses.  Ibuprofen 800mg  every 8 hours as needed for pain.  Complete course of antibiotics.  Follow up with your dentist for definitive treatment

## 2021-01-23 ENCOUNTER — Other Ambulatory Visit: Payer: Self-pay | Admitting: Adult Health

## 2021-04-16 ENCOUNTER — Other Ambulatory Visit: Payer: Self-pay

## 2021-04-16 ENCOUNTER — Other Ambulatory Visit (HOSPITAL_COMMUNITY)
Admission: RE | Admit: 2021-04-16 | Discharge: 2021-04-16 | Disposition: A | Discharge status: Home / Self Care | Payer: Self-pay | Source: Ambulatory Visit | Attending: Obstetrics & Gynecology | Admitting: Obstetrics & Gynecology

## 2021-04-16 DIAGNOSIS — N898 Other specified noninflammatory disorders of vagina: Secondary | ICD-10-CM | POA: Insufficient documentation

## 2021-04-16 NOTE — Progress Notes (Signed)
   NURSE VISIT- VAGINITIS/STD  SUBJECTIVE:  Julia Castro is a 30 y.o. [id number] GYN patientfemale here for a vaginal swab for vaginitis screening, STD screen.  She reports the following symptoms: odor for 1 month. Denies abnormal vaginal bleeding, significant pelvic pain, fever, or UTI symptoms.  OBJECTIVE:  There were no vitals taken for this visit.  Appears well, in no apparent distress  ASSESSMENT: Vaginal swab for  vaginitis & STD screening  PLAN: Self-collected vaginal probe for Gonorrhea, Chlamydia, Trichomonas, Bacterial Vaginosis, Yeast sent to lab Treatment: to be determined once results are received Follow-up as needed if symptoms persist/worsen, or new symptoms develop  Amran Malter A Asal Teas  04/16/2021 9:30 AM

## 2021-04-16 NOTE — Progress Notes (Signed)
Chart reviewed for nurse visit. Agree with plan of care.  Adline Potter, NP 04/16/2021 10:23 AM

## 2021-04-17 LAB — CERVICOVAGINAL ANCILLARY ONLY
Bacterial Vaginitis (gardnerella): POSITIVE — AB
Candida Glabrata: NEGATIVE
Candida Vaginitis: NEGATIVE
Chlamydia: NEGATIVE
Comment: NEGATIVE
Comment: NEGATIVE
Comment: NEGATIVE
Comment: NEGATIVE
Comment: NEGATIVE
Comment: NORMAL
Neisseria Gonorrhea: NEGATIVE
Trichomonas: NEGATIVE

## 2021-04-18 ENCOUNTER — Other Ambulatory Visit: Payer: Self-pay | Admitting: Adult Health

## 2021-04-18 MED ORDER — METRONIDAZOLE 500 MG PO TABS: 500.0000 mg | ORAL_TABLET | Freq: Two times a day (BID) | ORAL | 0 refills | Status: DC

## 2021-04-18 NOTE — Progress Notes (Signed)
+  BV on vaginal swab will rx flagyl 

## 2021-07-03 ENCOUNTER — Other Ambulatory Visit (HOSPITAL_COMMUNITY)
Admission: RE | Admit: 2021-07-03 | Discharge: 2021-07-03 | Disposition: A | Discharge status: Home / Self Care | Payer: Self-pay | Source: Ambulatory Visit | Attending: Obstetrics & Gynecology | Admitting: Obstetrics & Gynecology

## 2021-07-03 ENCOUNTER — Other Ambulatory Visit (INDEPENDENT_AMBULATORY_CARE_PROVIDER_SITE_OTHER): Payer: Self-pay

## 2021-07-03 ENCOUNTER — Other Ambulatory Visit: Payer: Self-pay

## 2021-07-03 DIAGNOSIS — N898 Other specified noninflammatory disorders of vagina: Secondary | ICD-10-CM

## 2021-07-03 NOTE — Progress Notes (Signed)
   NURSE VISIT- VAGINITIS  SUBJECTIVE:  Julia Castro is a 30 y.o. [id number] GYN patientfemale here for a vaginal swab for vaginitis screening.  She reports the following symptoms: odor for  2 weeks. Denies abnormal vaginal bleeding, significant pelvic pain, fever, or UTI symptoms.  OBJECTIVE:  There were no vitals taken for this visit.  Appears well, in no apparent distress  ASSESSMENT: Vaginal swab for vaginitis screening  PLAN: Self-collected vaginal probe for Bacterial Vaginosis, Yeast sent to lab Treatment: to be determined once results are received Follow-up as needed if symptoms persist/worsen, or new symptoms develop  Malachy Mood  07/03/2021 10:57 AM

## 2021-07-03 NOTE — Progress Notes (Signed)
Chart reviewed for nurse visit. Agree with plan of care.  Adline Potter, NP 07/03/2021 11:47 AM

## 2021-07-04 ENCOUNTER — Other Ambulatory Visit: Payer: Self-pay | Admitting: Adult Health

## 2021-07-04 LAB — CERVICOVAGINAL ANCILLARY ONLY
Bacterial Vaginitis (gardnerella): POSITIVE — AB
Candida Glabrata: NEGATIVE
Candida Vaginitis: NEGATIVE
Comment: NEGATIVE
Comment: NEGATIVE
Comment: NEGATIVE

## 2021-07-04 MED ORDER — METRONIDAZOLE 500 MG PO TABS: 500.0000 mg | ORAL_TABLET | Freq: Two times a day (BID) | ORAL | 0 refills | Status: DC

## 2021-07-04 NOTE — Progress Notes (Signed)
+  BV on vaginal swab Rx sent in for flagyl. No sex or alcohol during treatment

## 2021-10-23 ENCOUNTER — Other Ambulatory Visit: Payer: Self-pay

## 2021-10-23 ENCOUNTER — Other Ambulatory Visit (HOSPITAL_COMMUNITY)
Admission: RE | Admit: 2021-10-23 | Discharge: 2021-10-23 | Disposition: A | Discharge status: Home / Self Care | Payer: Self-pay | Source: Ambulatory Visit | Attending: Adult Health | Admitting: Adult Health

## 2021-10-23 ENCOUNTER — Encounter: Payer: Self-pay | Admitting: Adult Health

## 2021-10-23 ENCOUNTER — Ambulatory Visit (INDEPENDENT_AMBULATORY_CARE_PROVIDER_SITE_OTHER): Payer: Self-pay | Admitting: Adult Health

## 2021-10-23 VITALS — BP 128/87 | HR 69 | Ht 61.0 in | Wt 196.0 lb

## 2021-10-23 DIAGNOSIS — N6311 Unspecified lump in the right breast, upper outer quadrant: Secondary | ICD-10-CM

## 2021-10-23 DIAGNOSIS — Z124 Encounter for screening for malignant neoplasm of cervix: Secondary | ICD-10-CM | POA: Insufficient documentation

## 2021-10-23 DIAGNOSIS — K649 Unspecified hemorrhoids: Secondary | ICD-10-CM | POA: Insufficient documentation

## 2021-10-23 NOTE — Progress Notes (Signed)
Patient ID: Julia Castro, female   DOB: June 24, 1991, 31 y.o.   MRN: [id number] ?History of Present Illness: ?Julia Castro is a 31 year old black female, single, [id number] in complaining of hemorrhoids, that have gotten worse in last 6 months. She also felt breast lump on the right. She needs pap too.  ?She is self pay today. ? ?Current Medications, Allergies, Past Medical History, Past Surgical History, Family History and Social History were reviewed in [employer] record.   ? ? ?Review of Systems: ?+hemorrhoids ?Right breast lump ?Had 2 periods last month ? ? ? ?Physical Exam:BP 128/87 (BP Location: Right Arm, Patient Position: Sitting, Cuff Size: Normal)   Pulse 69   Ht 5\' 1"  (1.549 m)   Wt 196 lb (88.9 kg)   LMP 10/02/2021   BMI 37.03 kg/m?   ?General:  Well developed, well nourished, no acute distress ?Skin:  Warm and dry ?Lungs; Clear to auscultation bilaterally ?Breast:  No dominant palpable mass, retraction, or nipple discharge, on the left, on the right has regular irregularities and pea sized mass at 9-10 o'clock at areola edge, no retraction or nipple discharge ?Cardiovascular: Regular rate and rhythm ?Pelvic:  External genitalia is normal in appearance, no lesions.  The vagina is normal in appearance. Urethra has no lesions or masses. The cervix is bulbous.Pap with GC/CHL and HR HPV genotyping.   Uterus is felt to be normal size, shape, and contour.  No adnexal masses or tenderness noted.Bladder is non tender, no masses felt. ?Rectal:has external hemorrhoid  ?Psych:  No mood changes, alert and cooperative,seems happy ? [facility] - 10/23/21 0943   ? ?  ? Pregnancy Intention Screening  ? Does the patient want to become pregnant in the next year? No   ? Does the patient's partner want to become pregnant in the next year? No   ? Would the patient like to discuss contraceptive options today? No   ?  ? Contraception Wrap Up  ? Current Method Female Sterilization   ? End Method Female  Sterilization   ? Contraception Counseling Provided No   ? ?  ?  ? ?  ?  ?Co exam with with Balinda Quails NP student ? ?Impression and Plan: ?1. Routine cervical smear ?Pap sent ?Pap in 3 years of normal ? ?2. Hemorrhoids, unspecified hemorrhoid type ?Called in hemorrhoid with out nitroglycerin #30 gm use 2-3 x daily prn, with 1 refill, to [place]  ?Use wet swipes ?Sitz bath or warm compresses ?Warm bath with episome salt ?Use Miralax ?Try to push in at bedtime  ?Follow up prn  ? ? ? ?3. Mass of upper outer quadrant of right breast ?Referred to [facility] for mammogram ? ? ? ?  ?  ?

## 2021-10-28 LAB — CYTOLOGY - PAP
Chlamydia: NEGATIVE
Comment: NEGATIVE
Comment: NEGATIVE
Comment: NORMAL
Diagnosis: NEGATIVE
High risk HPV: NEGATIVE
Neisseria Gonorrhea: NEGATIVE

## 2022-02-12 ENCOUNTER — Other Ambulatory Visit (INDEPENDENT_AMBULATORY_CARE_PROVIDER_SITE_OTHER): Payer: Self-pay

## 2022-02-12 DIAGNOSIS — N898 Other specified noninflammatory disorders of vagina: Secondary | ICD-10-CM

## 2022-02-12 NOTE — Progress Notes (Signed)
   NURSE VISIT- VAGINITIS  SUBJECTIVE:  Julia Castro is a 31 y.o. [id number] GYN patientfemale here for a vaginal swab for vaginitis screening.  She reports the following symptoms: odor for 2 weeks. Denies abnormal vaginal bleeding, significant pelvic pain, fever, or UTI symptoms.  OBJECTIVE:  LMP 01/22/2022   Appears well, in no apparent distress  ASSESSMENT: Vaginal swab for vaginitis screening  PLAN: Self-collected vaginal probe for Bacterial Vaginosis, Yeast sent to lab Treatment: to be determined once results are received Follow-up as needed if symptoms persist/worsen, or new symptoms develop  Jobe Marker  02/12/2022 2:42 PM

## 2022-02-14 ENCOUNTER — Other Ambulatory Visit: Payer: Self-pay | Admitting: Adult Health

## 2022-02-14 LAB — CERVICOVAGINAL ANCILLARY ONLY
Bacterial Vaginitis (gardnerella): POSITIVE — AB
Candida Glabrata: NEGATIVE
Candida Vaginitis: NEGATIVE
Comment: NEGATIVE
Comment: NEGATIVE
Comment: NEGATIVE

## 2022-02-14 MED ORDER — METRONIDAZOLE 500 MG PO TABS: 500.0000 mg | ORAL_TABLET | Freq: Two times a day (BID) | ORAL | 0 refills | Status: DC

## 2022-02-14 NOTE — Progress Notes (Signed)
+  BV on vaginal swab will rx flagyl,no sex or alcohol while taking  ?

## 2022-06-18 ENCOUNTER — Other Ambulatory Visit (HOSPITAL_COMMUNITY)
Admission: RE | Admit: 2022-06-18 | Discharge: 2022-06-18 | Disposition: A | Discharge status: Home / Self Care | Payer: Self-pay | Source: Ambulatory Visit | Attending: Obstetrics & Gynecology | Admitting: Obstetrics & Gynecology

## 2022-06-18 ENCOUNTER — Other Ambulatory Visit (INDEPENDENT_AMBULATORY_CARE_PROVIDER_SITE_OTHER): Payer: Self-pay

## 2022-06-18 DIAGNOSIS — N898 Other specified noninflammatory disorders of vagina: Secondary | ICD-10-CM

## 2022-06-18 NOTE — Progress Notes (Signed)
   NURSE VISIT- VAGINITIS  SUBJECTIVE:  Julia Castro is a 31 y.o. [id number] GYN patientfemale here for a vaginal swab for vaginitis screening.  She reports the following symptoms: odor for 4 days.  Seems to occur after every cycle Denies abnormal vaginal bleeding, significant pelvic pain, fever, or UTI symptoms.  OBJECTIVE:  There were no vitals taken for this visit.  Appears well, in no apparent distress  ASSESSMENT: Vaginal swab for vaginitis screening  PLAN: Self-collected vaginal probe for Bacterial Vaginosis, Yeast sent to lab Treatment: to be determined once results are received Follow-up as needed if symptoms persist/worsen, or new symptoms develop  Jobe Marker  06/18/2022 11:13 AM

## 2022-06-19 LAB — CERVICOVAGINAL ANCILLARY ONLY
Bacterial Vaginitis (gardnerella): POSITIVE — AB
Candida Glabrata: NEGATIVE
Candida Vaginitis: NEGATIVE
Comment: NEGATIVE
Comment: NEGATIVE
Comment: NEGATIVE

## 2022-06-20 ENCOUNTER — Other Ambulatory Visit: Payer: Self-pay | Admitting: Adult Health

## 2022-06-20 MED ORDER — METRONIDAZOLE 500 MG PO TABS: 500.0000 mg | ORAL_TABLET | Freq: Two times a day (BID) | ORAL | 0 refills | Status: AC

## 2022-06-20 NOTE — Progress Notes (Signed)
+  BV on vaginal swab will rx flagyl, no sex or alcohol while taking meds.  

## 2023-02-11 ENCOUNTER — Ambulatory Visit
Admission: EM | Admit: 2023-02-11 | Discharge: 2023-02-11 | Disposition: A | Discharge status: Home / Self Care | Payer: Self-pay | Attending: Nurse Practitioner | Admitting: Nurse Practitioner

## 2023-02-11 DIAGNOSIS — R21 Rash and other nonspecific skin eruption: Secondary | ICD-10-CM

## 2023-02-11 DIAGNOSIS — L304 Erythema intertrigo: Secondary | ICD-10-CM

## 2023-02-11 MED ORDER — CLOTRIMAZOLE-BETAMETHASONE 1-0.05 % EX CREA: TOPICAL_CREAM | CUTANEOUS | 0 refills | Status: AC

## 2023-02-11 NOTE — ED Provider Notes (Signed)
[facility]    CSN: [id number] Arrival date & time: 02/11/23  1223      History   Chief Complaint No chief complaint on file.   HPI Julia Castro is a 32 y.o. female.   The history is provided by the patient.   The patient presents for complaints of rash has been present for the past 6 months.  Patient states over the last several days, and since the weather has gotten hotter, the rash has worsened.  She states that the rash is "itchy".  She states rash is located under her left breast, under her arms, on her left thigh, and on the left side of her neck.  Patient denies fever, chills, exposure to new soaps, medications, foods, lotions, or detergents.  She states that she has been using cocoa butter, and works on keeping the areas clean and dry, especially the one under her breast.  She denies prior history of eczema or psoriasis.  Past Medical History:  Diagnosis Date   Chlamydia    Heart murmur    Benign   Panic attacks    Vaginal Pap smear, abnormal     Patient Active Problem List   Diagnosis Date Noted   Mass of upper outer quadrant of right breast 10/23/2021   Hemorrhoids 10/23/2021   Routine cervical smear 10/23/2021   Yeast vaginitis 12/07/2020   Vaginal discharge 12/07/2020   Nexplanon insertion 05/08/2016   Pica 01/09/2016   Sickle cell trait (HCC) 10/01/2015   Smoker 08/29/2015   Anxiety w/ panic attacks 08/29/2015    Past Surgical History:  Procedure Laterality Date   INDUCED ABORTION  09/2013   LAPAROSCOPIC TUBAL LIGATION Bilateral 04/14/2018   Procedure: LAPAROSCOPIC BILATERAL TUBAL LIGATION USING ELECTROCAUTERY;  Surgeon: Lazaro Arms, MD;  Location: [facility];  Service: Gynecology;  Laterality: Bilateral;   REMOVAL OF NON VAGINAL CONTRACEPTIVE DEVICE Left 04/14/2018   Procedure: REMOVAL OF NON VAGINAL CONTRACEPTIVE DEVICE (NEXPLANON);  Surgeon: Lazaro Arms, MD;  Location: [facility];  Service: Gynecology;  Laterality: Left;   WISDOM TOOTH  EXTRACTION      OB History     Gravida  4   Para  3   Term  3   Preterm  0   AB  1   Living  3      SAB  0   IAB  0   Ectopic  0   Multiple  0   Live Births  3            Home Medications    Prior to Admission medications   Medication Sig Start Date End Date Taking? Authorizing Provider  metroNIDAZOLE (FLAGYL) 500 MG tablet Take 1 tablet (500 mg total) by mouth 2 (two) times daily. 06/20/22   Adline Potter, NP    Family History Family History  Problem Relation Age of Onset   Other Mother        heart issues   Seizures Father    Cancer Sister    Mental retardation Brother    Mental illness Maternal Grandmother    Anxiety disorder Maternal Grandmother    Alcohol abuse Maternal Grandfather        in the past   Anxiety disorder Paternal Grandmother    Other Brother        MVA   SIDS Sister    Anesthesia problems Neg Hx     Social History Social History   Tobacco Use   Smoking status:  Every Day    Years: 3    Types: Cigarettes   Smokeless tobacco: Never   Tobacco comments:    smokes 1 daily  Vaping Use   Vaping Use: Never used  Substance Use Topics   Alcohol use: Yes    Alcohol/week: 1.0 standard drink of alcohol    Types: 1 Glasses of wine per week   Drug use: No     Allergies   Patient has no known allergies.   Review of Systems Review of Systems Per HPI  Physical Exam Triage Vital Signs ED Triage Vitals  Enc Vitals Group     BP 02/11/23 1231 (!) 140/87     Pulse Rate 02/11/23 1231 (!) 55     Resp 02/11/23 1231 13     Temp 02/11/23 1231 99 F (37.2 C)     Temp Source 02/11/23 1231 Oral     SpO2 02/11/23 1231 96 %     Weight --      Height --      Head Circumference --      Peak Flow --      Pain Score 02/11/23 1234 0     Pain Loc --      Pain Edu? --      Excl. in GC? --    No data found.  Updated Vital Signs BP (!) 140/87 (BP Location: Right Arm)   Pulse (!) 55   Temp 99 F (37.2 C) (Oral)   Resp  13   LMP 02/08/2023 (Exact Date)   SpO2 96%   Visual Acuity Right Eye Distance:   Left Eye Distance:   Bilateral Distance:    Right Eye Near:   Left Eye Near:    Bilateral Near:     Physical Exam Vitals and nursing note reviewed.  Constitutional:      General: She is not in acute distress.    Appearance: Normal appearance.  HENT:     Head: Normocephalic.  Eyes:     Extraocular Movements: Extraocular movements intact.     Pupils: Pupils are equal, round, and reactive to light.  Pulmonary:     Effort: Pulmonary effort is normal.  Musculoskeletal:     Cervical back: Normal range of motion.  Skin:    General: Skin is warm and dry.     Findings: Rash present. Rash is macular and papular.     Comments: Maculopapular rash noted in small areas ranging from 3 cm to 5 cm located on the posterior upper arms, on the left neck, and on the left thigh near the groin.  Rash under the left breast is patchy, smooth, with erythematous slightly raised borders.  There is no oozing, fluctuance, or drainage present.  Neurological:     General: No focal deficit present.     Mental Status: She is alert and oriented to person, place, and time.  Psychiatric:        Mood and Affect: Mood normal.        Behavior: Behavior normal.      UC Treatments / Results  Labs (all labs ordered are listed, but only abnormal results are displayed) Labs Reviewed - No data to display  EKG   Radiology No results found.  Procedures Procedures (including critical care time)  Medications Ordered in UC Medications - No data to display  Initial Impression / Assessment and Plan / UC Course  I have reviewed the triage vital signs and the nursing notes.  Pertinent labs &  imaging results that were available during my care of the patient were reviewed by me and considered in my medical decision making (see chart for details).  The patient is well-appearing, she is in no acute distress, vital signs are  stable.  Differential diagnoses include eczema, psoriasis, and intertrigo.  Do suspect intertrigo under the left breast.  Other areas appear to be consistent with atopic dermatitis.  Will treat with triamcinolone cream 0.1%.  Supportive care recommendations were provided and discussed with the patient to include keeping the areas clean and dry, avoiding hot baths or showers, and use of over-the-counter antihistamine such as Zyrtec for daytime and Benadryl at nighttime to help with itching.  Patient was advised that if symptoms do not improve with this treatment, recommend that she follow-up with her primary care physician for further evaluation and/or possible referral to dermatology.  Patient is in agreement with this plan of care and verbalizes understanding.  All questions were answered.  Patient stable for discharge.  Final Clinical Impressions(s) / UC Diagnoses   Final diagnoses:  None   Discharge Instructions   None    ED Prescriptions   None    PDMP not reviewed this encounter.   Abran Cantor, NP 02/11/23 1315

## 2023-02-11 NOTE — ED Triage Notes (Signed)
Pt c/o rash on arms and legs , intermittent for 6 mo's but in the last month it has been constant.

## 2023-02-11 NOTE — Discharge Instructions (Addendum)
Apply medication as prescribed. May also take over-the-counter Zyrtec during the daytime and Benadryl at nighttime to help with itching. Avoid hot baths or showers while symptoms persist.  Recommend taking lukewarm baths. May apply cool cloths to the area to help with itching or discomfort. Avoid scratching, rubbing, or manipulating the areas while symptoms persist. Recommend Aveeno colloidal oatmeal bath to use to help with drying and itching. If symptoms do not improve with this treatment, please follow-up with your primary care physician for further evaluation and possible referral to dermatology. Follow-up as needed.
